# Patient Record
Sex: Male | Born: 1950 | Race: White | Hispanic: No | Marital: Married | Smoking: Never smoker
Health system: Southern US, Community
[De-identification: ages and names within clinical notes are randomized; demographics above are authoritative.]

## PROBLEM LIST (undated history)

## (undated) DIAGNOSIS — S299XXA Unspecified injury of thorax, initial encounter: Secondary | ICD-10-CM

## (undated) DIAGNOSIS — E119 Type 2 diabetes mellitus without complications: Secondary | ICD-10-CM

## (undated) HISTORY — PX: COSMETIC SURGERY: SHX468

---

## 2003-09-06 ENCOUNTER — Ambulatory Visit (HOSPITAL_COMMUNITY): Admission: RE | Admit: 2003-09-06 | Discharge: 2003-09-06 | Payer: Self-pay | Admitting: *Deleted

## 2015-02-22 ENCOUNTER — Ambulatory Visit (INDEPENDENT_AMBULATORY_CARE_PROVIDER_SITE_OTHER): Payer: BLUE CROSS/BLUE SHIELD | Admitting: Emergency Medicine

## 2015-02-22 VITALS — BP 128/78 | HR 63 | Temp 98.0°F | Resp 18 | Ht 68.5 in | Wt 179.0 lb

## 2015-02-22 DIAGNOSIS — J209 Acute bronchitis, unspecified: Secondary | ICD-10-CM

## 2015-02-22 DIAGNOSIS — J0140 Acute pansinusitis, unspecified: Secondary | ICD-10-CM | POA: Diagnosis not present

## 2015-02-22 MED ORDER — AMOXICILLIN-POT CLAVULANATE 875-125 MG PO TABS: 1.0000 | ORAL_TABLET | Freq: Two times a day (BID) | ORAL | Status: DC

## 2015-02-22 MED ORDER — HYDROCOD POLST-CPM POLST ER 10-8 MG/5ML PO SUER: 5.0000 mL | Freq: Two times a day (BID) | ORAL | Status: DC

## 2015-02-22 MED ORDER — PSEUDOEPHEDRINE-GUAIFENESIN ER 60-600 MG PO TB12: 1.0000 | ORAL_TABLET | Freq: Two times a day (BID) | ORAL | Status: DC

## 2015-02-22 NOTE — Progress Notes (Signed)
Subjective:  Patient ID: Anthony Forbes, male    DOB: Apr 29, 1950  Age: 64 y.o. MRN: [id number]  CC: Sore Throat and Cough   HPI [name] presents  with nasal congestion postnasal drainage and nasal discharge purulent color. He has pressure in his cheeks his cheeks and forehead. Says he has a cough that's minimally productive he has increased cough when he lays down. He's said he is unable to sleep at night because of cough is no wheezing or shortness breath. He has no fever or chills. No nausea vomiting or stool change. No rash. No improvement with over-the-counter medication.  History Nirav has no past medical history on file.   He has no past surgical history on file.   His  family history is not on file.  He   reports that he has never smoked. He does not have any smokeless tobacco history on file. He reports that he drinks about 1.2 - 1.8 oz of alcohol per week. He reports that he does not use illicit drugs.  No outpatient prescriptions prior to visit.   No facility-administered medications prior to visit.    Social History   Social History  . Marital Status: Single    Spouse Name: N/A  . Number of Children: N/A  . Years of Education: N/A   Social History Main Topics  . Smoking status: Never Smoker   . Smokeless tobacco: None  . Alcohol Use: 1.2 - 1.8 oz/week    2-3 Standard drinks or equivalent per week  . Drug Use: No  . Sexual Activity: Not Asked   Other Topics Concern  . None   Social History Narrative  . None     Review of Systems  Constitutional: Positive for fatigue. Negative for fever, chills and appetite change.  HENT: Positive for congestion, postnasal drip, rhinorrhea, sinus pressure and sore throat. Negative for ear pain.   Eyes: Negative for pain and redness.  Respiratory: Positive for cough. Negative for shortness of breath and wheezing.   Cardiovascular: Negative for leg swelling.  Gastrointestinal: Negative for nausea,  vomiting, abdominal pain, diarrhea, constipation and blood in stool.  Endocrine: Negative for polyuria.  Genitourinary: Negative for dysuria, urgency, frequency and flank pain.  Musculoskeletal: Negative for gait problem.  Skin: Negative for rash.  Neurological: Negative for weakness and headaches.  Psychiatric/Behavioral: Negative for confusion and decreased concentration. The patient is not nervous/anxious.     Objective:  BP 128/78 mmHg  Pulse 63  Temp(Src) 98 F (36.7 C) (Oral)  Resp 18  Ht 5' 8.5" (1.74 m)  Wt 179 lb (81.194 kg)  BMI 26.82 kg/m2  SpO2 98%  Physical Exam  Constitutional: He is oriented to person, place, and time. He appears well-developed and well-nourished. No distress.  HENT:  Head: Normocephalic and atraumatic.  Right Ear: External ear normal.  Left Ear: External ear normal.  Nose: Nose normal.  Eyes: Conjunctivae and EOM are normal. Pupils are equal, round, and reactive to light. No scleral icterus.  Neck: Normal range of motion. Neck supple. No tracheal deviation present.  Cardiovascular: Normal rate, regular rhythm and normal heart sounds.   Pulmonary/Chest: Effort normal. No respiratory distress. He has no wheezes. He has no rales.  Abdominal: He exhibits no mass. There is no tenderness. There is no rebound and no guarding.  Musculoskeletal: He exhibits no edema.  Lymphadenopathy:    He has no cervical adenopathy.  Neurological: He is alert and oriented to person, place, and time. Coordination  normal.  Skin: Skin is warm and dry. No rash noted.  Psychiatric: He has a normal mood and affect. His behavior is normal.      Assessment & Plan:   Ronald PippinsDelbert was seen today for sore throat and cough.  Diagnoses and all orders for this visit:  Acute bronchitis, unspecified organism  Acute pansinusitis, recurrence not specified  Other orders -     amoxicillin-clavulanate (AUGMENTIN) 875-125 MG tablet; Take 1 tablet by mouth 2 (two) times daily. -      pseudoephedrine-guaifenesin (MUCINEX D) 60-600 MG 12 hr tablet; Take 1 tablet by mouth every 12 (twelve) hours. -     chlorpheniramine-HYDROcodone (TUSSIONEX PENNKINETIC ER) 10-8 MG/5ML SUER; Take 5 mLs by mouth 2 (two) times daily.  I am having Mr. Chisum start on amoxicillin-clavulanate, pseudoephedrine-guaifenesin, and chlorpheniramine-HYDROcodone.  Meds ordered this encounter  Medications  . amoxicillin-clavulanate (AUGMENTIN) 875-125 MG tablet    Sig: Take 1 tablet by mouth 2 (two) times daily.    Dispense:  20 tablet    Refill:  0  . pseudoephedrine-guaifenesin (MUCINEX D) 60-600 MG 12 hr tablet    Sig: Take 1 tablet by mouth every 12 (twelve) hours.    Dispense:  18 tablet    Refill:  0  . chlorpheniramine-HYDROcodone (TUSSIONEX PENNKINETIC ER) 10-8 MG/5ML SUER    Sig: Take 5 mLs by mouth 2 (two) times daily.    Dispense:  60 mL    Refill:  0    Appropriate red flag conditions were discussed with the patient as well as actions that should be taken.  Patient expressed his understanding.  Follow-up: Return if symptoms worsen or fail to improve.  Carmelina DaneAnderson, Julie-Anne Torain S, MD

## 2015-02-22 NOTE — Patient Instructions (Signed)

## 2015-04-18 ENCOUNTER — Ambulatory Visit (INDEPENDENT_AMBULATORY_CARE_PROVIDER_SITE_OTHER): Payer: BLUE CROSS/BLUE SHIELD

## 2015-04-18 ENCOUNTER — Ambulatory Visit (INDEPENDENT_AMBULATORY_CARE_PROVIDER_SITE_OTHER): Payer: BLUE CROSS/BLUE SHIELD | Admitting: Emergency Medicine

## 2015-04-18 VITALS — BP 124/80 | HR 70 | Temp 98.6°F | Resp 17 | Ht 67.0 in | Wt 177.0 lb

## 2015-04-18 DIAGNOSIS — R05 Cough: Secondary | ICD-10-CM | POA: Diagnosis not present

## 2015-04-18 DIAGNOSIS — R059 Cough, unspecified: Secondary | ICD-10-CM

## 2015-04-18 DIAGNOSIS — R062 Wheezing: Secondary | ICD-10-CM | POA: Diagnosis not present

## 2015-04-18 LAB — POCT CBC
Granulocyte percent: 65 %G (ref 37–80)
HCT, POC: 41.9 % — AB (ref 43.5–53.7)
Hemoglobin: 14.6 g/dL (ref 14.1–18.1)
Lymph, poc: 1.7 (ref 0.6–3.4)
MCH: 28.4 pg (ref 27–31.2)
MCHC: 34.8 g/dL (ref 31.8–35.4)
MCV: 81.7 fL (ref 80–97)
MID (CBC): 0.6 (ref 0–0.9)
MPV: 8.3 fL (ref 0–99.8)
PLATELET COUNT, POC: 186 10*3/uL (ref 142–424)
POC Granulocyte: 4.2 (ref 2–6.9)
POC LYMPH PERCENT: 26.3 %L (ref 10–50)
POC MID %: 8.7 %M (ref 0–12)
RBC: 5.13 M/uL (ref 4.69–6.13)
RDW, POC: 15.5 %
WBC: 6.5 10*3/uL (ref 4.6–10.2)

## 2015-04-18 MED ORDER — DOXYCYCLINE HYCLATE 100 MG PO TABS: 100.0000 mg | ORAL_TABLET | Freq: Two times a day (BID) | ORAL | Status: DC

## 2015-04-18 MED ORDER — ALBUTEROL SULFATE (2.5 MG/3ML) 0.083% IN NEBU
2.5000 mg | INHALATION_SOLUTION | Freq: Once | RESPIRATORY_TRACT | Status: AC
  Administered 2015-04-18: 2.5 mg via RESPIRATORY_TRACT

## 2015-04-18 MED ORDER — IPRATROPIUM BROMIDE 0.02 % IN SOLN
0.5000 mg | Freq: Once | RESPIRATORY_TRACT | Status: AC
  Administered 2015-04-18: 0.5 mg via RESPIRATORY_TRACT

## 2015-04-18 NOTE — Patient Instructions (Signed)

## 2015-04-18 NOTE — Progress Notes (Addendum)
Patient ID: Anthony Forbes, male   DOB: 12/02/1950, 65 y.o.   MRN: [id number]    By signing my name below, I, Essence Howell, attest that this documentation has been prepared under the direction and in the presence of Collene GobbleSteven A Marquest Gunkel, MD Electronically Signed: Charline BillsEssence Howell, ED Scribe 04/18/2015 at 8:26 AM.  Chief Complaint:  Chief Complaint  Patient presents with  . Cough  . URI   HPI: Anthony ChardDelbert R Phung is a 65 y.o. male who [facility] complaining of gradually worsening productive cough with white sputum for the past week.  Pt reports associated chest congestion, wheezing, night sweats and subjective fever that has resolved. He was seen in office in November 2016 for the same, started on Amoxicillin with resolve. Pt reports working excessively due to the holidays and suspects that this may have caused a decline his resistance. He denies sore throat, nausea, vomiting. Pt reports h/o PNA approximately 30 years ago. No h/o asthma, bronchitis or inhaler use. No sick contacts.   No past medical history on file. No past surgical history on file. Social History   Social History  . Marital Status: Married    Spouse Name: N/A  . Number of Children: N/A  . Years of Education: N/A   Social History Main Topics  . Smoking status: Never Smoker   . Smokeless tobacco: None  . Alcohol Use: 1.2 - 1.8 oz/week    2-3 Standard drinks or equivalent per week  . Drug Use: No  . Sexual Activity: Not Asked   Other Topics Concern  . None   Social History Narrative   No family history on file. No Known Allergies Prior to Admission medications   Not on File    ROS: The patient denies chills, sore throat, unintentional weight loss, chest pain, palpitations, dyspnea on exertion, nausea, vomiting, abdominal pain, dysuria, hematuria, melena, numbness, weakness, or tingling. +fever, +night sweats, +wheezing, +cough  All other systems have been reviewed and were otherwise negative with the  exception of those mentioned in the HPI and as above.    PHYSICAL EXAM: Filed Vitals:   04/18/15 0813  BP: 124/80  Pulse: 76  Temp: 98.6 F (37 C)  Resp: 17   Body mass index is 27.72 kg/(m^2).   General: Alert, no acute distress HEENT:  Normocephalic, atraumatic, oropharynx patent. [facility] Cardiovascular:  Regular rate and rhythm, no rubs murmurs or gallops.  No Carotid bruits, radial pulse intact. No pedal edema.  Respiratory: No rales. With forced expiration there is wheezing noted. Coarse rhonchi.  No cyanosis, no use of accessory musculature Abdominal: No organomegaly, abdomen is soft and non-tender, positive bowel sounds.  No masses. Musculoskeletal: Gait intact. No edema, tenderness Skin: No rashes. Neurologic: Facial musculature symmetric. Psychiatric: Patient acts appropriately throughout our interaction. Lymphatic: No cervical or submandibular lymphadenopathy Meds ordered this encounter  Medications  . albuterol (PROVENTIL) (2.5 MG/3ML) 0.083% nebulizer solution 2.5 mg    Sig:   . ipratropium (ATROVENT) nebulizer solution 0.5 mg    Sig:   . doxycycline (VIBRA-TABS) 100 MG tablet    Sig: Take 1 tablet (100 mg total) by mouth 2 (two) times daily.    Dispense:  20 tablet    Refill:  0   LABS: Results for orders placed or performed in visit on 04/18/15  POCT CBC  Result Value Ref Range   WBC 6.5 4.6 - 10.2 K/uL   Lymph, poc 1.7 0.6 - 3.4   POC LYMPH  PERCENT 26.3 10 - 50 %L   MID (cbc) 0.6 0 - 0.9   POC MID % 8.7 0 - 12 %M   POC Granulocyte 4.2 2 - 6.9   Granulocyte percent 65.0 37 - 80 %G   RBC 5.13 4.69 - 6.13 M/uL   Hemoglobin 14.6 14.1 - 18.1 g/dL   HCT, POC 16.1 (A) 09.6 - 53.7 %   MCV 81.7 80 - 97 fL   MCH, POC 28.4 27 - 31.2 pg   MCHC 34.8 31.8 - 35.4 g/dL   RDW, POC 04.5 %   Platelet Count, POC 186 142 - 424 K/uL   MPV 8.3 0 - 99.8 fL   EKG/XRAY:   Primary read interpreted by Dr. Cleta Alberts at [facility]. There is significant increased markings  left base   ASSESSMENT/PLAN: CBC unremarkable. There is significant increased markings in the left base. We'll give a nebulizer treatment today. We'll treat as acute bronchitis. He was placed on doxycycline. He did not get any improvement with the nebulizers so he was not given an albuterol prescription.I personally performed the services described in this documentation, which was scribed in my presence. The recorded information has been reviewed and is accurate.    Gross sideeffects, risk and benefits, and alternatives of medications d/w patient. Patient is aware that all medications have potential sideeffects and we are unable to predict every sideeffect or drug-drug interaction that may occur.  Lesle Chris MD 04/18/2015 8:17 AM

## 2017-05-11 ENCOUNTER — Emergency Department (HOSPITAL_COMMUNITY)
Admission: EM | Admit: 2017-05-11 | Discharge: 2017-05-11 | Disposition: A | Discharge status: Home / Self Care | Payer: Medicare HMO | Attending: Emergency Medicine | Admitting: Emergency Medicine

## 2017-05-11 ENCOUNTER — Other Ambulatory Visit: Payer: Self-pay

## 2017-05-11 ENCOUNTER — Encounter (HOSPITAL_COMMUNITY): Payer: Self-pay

## 2017-05-11 DIAGNOSIS — R5383 Other fatigue: Secondary | ICD-10-CM | POA: Insufficient documentation

## 2017-05-11 DIAGNOSIS — M6281 Muscle weakness (generalized): Secondary | ICD-10-CM | POA: Insufficient documentation

## 2017-05-11 DIAGNOSIS — R531 Weakness: Secondary | ICD-10-CM

## 2017-05-11 DIAGNOSIS — R61 Generalized hyperhidrosis: Secondary | ICD-10-CM | POA: Insufficient documentation

## 2017-05-11 LAB — COMPREHENSIVE METABOLIC PANEL
ALT: 16 U/L — ABNORMAL LOW (ref 17–63)
ANION GAP: 10 (ref 5–15)
AST: 25 U/L (ref 15–41)
Albumin: 3.7 g/dL (ref 3.5–5.0)
Alkaline Phosphatase: 40 U/L (ref 38–126)
BILIRUBIN TOTAL: 1.3 mg/dL — AB (ref 0.3–1.2)
BUN: 14 mg/dL (ref 6–20)
CO2: 22 mmol/L (ref 22–32)
Calcium: 9.1 mg/dL (ref 8.9–10.3)
Chloride: 108 mmol/L (ref 101–111)
Creatinine, Ser: 1.1 mg/dL (ref 0.61–1.24)
GFR calc Af Amer: >60 mL/min (ref >60)
Glucose, Bld: 186 mg/dL — ABNORMAL HIGH (ref 65–99)
POTASSIUM: 4.9 mmol/L (ref 3.5–5.1)
Sodium: 140 mmol/L (ref 135–145)
TOTAL PROTEIN: 6.2 g/dL — AB (ref 6.5–8.1)

## 2017-05-11 LAB — INFLUENZA PANEL BY PCR (TYPE A & B)
INFLAPCR: NEGATIVE
Influenza B By PCR: NEGATIVE

## 2017-05-11 LAB — CBC WITH DIFFERENTIAL/PLATELET
BASOS PCT: 0 %
Basophils Absolute: 0 10*3/uL (ref 0.0–0.1)
EOS PCT: 0 %
Eosinophils Absolute: 0 10*3/uL (ref 0.0–0.7)
HEMATOCRIT: 44.2 % (ref 39.0–52.0)
Hemoglobin: 15.2 g/dL (ref 13.0–17.0)
LYMPHS PCT: 3 %
Lymphs Abs: 0.4 10*3/uL — ABNORMAL LOW (ref 0.7–4.0)
MCH: 31.8 pg (ref 26.0–34.0)
MCHC: 34.4 g/dL (ref 30.0–36.0)
MCV: 92.5 fL (ref 78.0–100.0)
MONOS PCT: 4 %
Monocytes Absolute: 0.6 10*3/uL (ref 0.1–1.0)
NEUTROS ABS: 14.2 10*3/uL — AB (ref 1.7–7.7)
Neutrophils Relative %: 93 %
PLATELETS: 191 10*3/uL (ref 150–400)
RBC: 4.78 MIL/uL (ref 4.22–5.81)
RDW: 12.7 % (ref 11.5–15.5)
WBC: 15.2 10*3/uL — ABNORMAL HIGH (ref 4.0–10.5)

## 2017-05-11 LAB — I-STAT TROPONIN, ED
TROPONIN I, POC: 0 ng/mL (ref 0.00–0.08)
TROPONIN I, POC: 0 ng/mL (ref 0.00–0.08)

## 2017-05-11 MED ORDER — SODIUM CHLORIDE 0.9 % IV BOLUS (SEPSIS)
500.0000 mL | Freq: Once | INTRAVENOUS | Status: AC
  Administered 2017-05-11: 500 mL via INTRAVENOUS

## 2017-05-11 NOTE — ED Notes (Signed)
ED Provider at bedside. 

## 2017-05-11 NOTE — ED Triage Notes (Signed)
Pty c/o increased fatique over course of day. Pt states he was skiing yesterday and only drank 1.5 bottles of water. And drank 3 beers with [employer]. C/o dizzy, diaphoretic and blurred vision increased until 1430. Came POV to fire station. Initial BP 86/p given 500cc NS PTA.Denies c/o on arrival.

## 2017-05-11 NOTE — Discharge Instructions (Signed)
It was my pleasure taking care of you today!   I will call you if your flu test comes back positive.   Please call both your primary care doctor and the cardiology clinic listed to schedule a follow up appointment.   Return to ER for new or worsening symptoms, any additional concerns.

## 2017-05-11 NOTE — ED Provider Notes (Signed)
[facility] Provider Note   CSN: [id number] Arrival date & time: 05/11/17  1609     History   Chief Complaint Chief Complaint  Patient presents with  . Weakness    HPI Anthony ChardDelbert R Samano is a 67 y.o. male.  The history is provided by the patient and medical records. No language interpreter was used.   Anthony Forbes is a 67 y.o. male  with a PMH with no known PMH and on no daily medications who presents to the Emergency Department complaining of weakness and fatigue which began today. Patient reports that he went snow skiing with grandchildren yesterday. He had a very active day and only drank 1-2 water bottles. When he awoke this morning, he felt tired and thirsty, but did not think much about it.  Today, he felt as if his fatigue worsened.  At about 230, he suddenly felt sweaty all over.  He denies any additional symptoms with this.  He states that he had no headaches, blurred vision, dizziness, chest pain, shortness of breath, abdominal pain or back pain.  He just suddenly felt very sweaty and much more tired.  He has no history of this happening in the past.  His daughter gave him full strength aspirin and drove him to the fire station.  Per nursing staff, initial blood pressure 86/p and 500 cc normal saline given prior to arrival.  Patient states that he now feels better, still feels very weak, but no longer sweaty.  History reviewed. No pertinent past medical history.  There are no active problems to display for this patient.   Past Surgical History:  Procedure Laterality Date  . COSMETIC SURGERY         Home Medications    Prior to Admission medications   Medication Sig Start Date End Date Taking? Authorizing Provider  doxycycline (VIBRA-TABS) 100 MG tablet Take 1 tablet (100 mg total) by mouth 2 (two) times daily. 04/18/15   Collene Gobbleaub, Steven A, MD    Family History No family history on file.  Social History Social History    Tobacco Use  . Smoking status: Never Smoker  . Smokeless tobacco: Never Used  Substance Use Topics  . Alcohol use: Yes    Alcohol/week: 1.2 - 1.8 oz    Types: 2 - 3 Standard drinks or equivalent per week  . Drug use: No     Allergies   Patient has no known allergies.   Review of Systems Review of Systems  Constitutional: Positive for diaphoresis. Negative for chills and fever.  All other systems reviewed and are negative.    Physical Exam Updated Vital Signs BP 136/87   Pulse 80   Temp 98.1 F (36.7 C) (Oral)   Resp (!) 23   Ht 5\' 8"  (1.727 m)   Wt 78 kg (172 lb)   SpO2 97%   BMI 26.15 kg/m   Physical Exam  Constitutional: He is oriented to person, place, and time. He appears well-developed and well-nourished. No distress.  HENT:  Head: Normocephalic and atraumatic.  Neck: Neck supple. No JVD present.  Cardiovascular: Normal rate, regular rhythm and normal heart sounds.  No murmur heard. Pulmonary/Chest: Effort normal and breath sounds normal. No respiratory distress. He has no wheezes. He has no rales.  Abdominal: Soft. He exhibits no distension. There is no tenderness.  Musculoskeletal: He exhibits no edema.  Neurological: He is alert and oriented to person, place, and time.  Skin: Skin is warm  and dry.  Nursing note and vitals reviewed.    ED Treatments / Results  Labs (all labs ordered are listed, but only abnormal results are displayed) Labs Reviewed  CBC WITH DIFFERENTIAL/PLATELET - Abnormal; Notable for the following components:      Result Value   WBC 15.2 (*)    Neutro Abs 14.2 (*)    Lymphs Abs 0.4 (*)    All other components within normal limits  COMPREHENSIVE METABOLIC PANEL - Abnormal; Notable for the following components:   Glucose, Bld 186 (*)    Total Protein 6.2 (*)    ALT 16 (*)    Total Bilirubin 1.3 (*)    All other components within normal limits  INFLUENZA PANEL BY PCR (TYPE A & B)  I-STAT TROPONIN, ED  I-STAT TROPONIN, ED     EKG  EKG Interpretation  Date/Time:  Monday May 11 2017 16:56:20 EST Ventricular Rate:  79 PR Interval:    QRS Duration: 104 QT Interval:  377 QTC Calculation: 433 R Axis:   27 Text Interpretation:  Sinus rhythm Borderline T wave abnormalities Baseline wander in lead(s) V1 significant artifact, but no prior tracings available Confirmed by Jerelyn Scott [phone]) on 05/11/2017 5:36:02 PM       Radiology No results found.  Procedures Procedures (including critical care time)  Medications Ordered in ED Medications  sodium chloride 0.9 % bolus 500 mL (0 mLs Intravenous Stopped 05/11/17 1825)     Initial Impression / Assessment and Plan / ED Course  I have reviewed the triage vital signs and the nursing notes.  Pertinent labs & imaging results that were available during my care of the patient were reviewed by me and considered in my medical decision making (see chart for details).    TRUEMAN WORLDS is a 67 y.o. male who presents to ED for weakness, fatigue and diaphoresis beginning today. Per EMS, hypotensive in the field and given 500cc saline PTA. Normotensive / hemodynamically stable upon arrival to ED. Denies chest pain and shortness of breath. Does have leukocytosis of 15.2. Flu negative. Given 1L IV fluids in total and feels much improved on re-evaluation. EKG with non-specific t wave changes. Troponin negative x 2. Heart score of 3. Evaluation does not show pathology that would require ongoing emergent intervention or inpatient treatment. Feel patient does need outpatient cardiology follow up and referral information provided. He will also follow up with PCP. He and wife are comfortable with this plan, however we did speak at length about return precautions. All questions answered.   Patient seen by and discussed with Dr. Phineas Real who agrees with treatment plan.   Final Clinical Impressions(s) / ED Diagnoses   Final diagnoses:  Weakness    ED Discharge Orders     None       Bri Wakeman, Chase Picket, PA-C 05/11/17 2330    Phillis Haggis, MD 05/12/17 [phone]

## 2018-07-11 ENCOUNTER — Other Ambulatory Visit: Payer: Self-pay

## 2018-07-11 ENCOUNTER — Emergency Department (HOSPITAL_BASED_OUTPATIENT_CLINIC_OR_DEPARTMENT_OTHER): Payer: Medicare HMO

## 2018-07-11 ENCOUNTER — Encounter (HOSPITAL_BASED_OUTPATIENT_CLINIC_OR_DEPARTMENT_OTHER): Payer: Self-pay | Admitting: Emergency Medicine

## 2018-07-11 ENCOUNTER — Emergency Department (HOSPITAL_BASED_OUTPATIENT_CLINIC_OR_DEPARTMENT_OTHER)
Admission: EM | Admit: 2018-07-11 | Discharge: 2018-07-11 | Disposition: A | Discharge status: Home / Self Care | Payer: Medicare HMO | Attending: Emergency Medicine | Admitting: Emergency Medicine

## 2018-07-11 DIAGNOSIS — S0181XA Laceration without foreign body of other part of head, initial encounter: Secondary | ICD-10-CM | POA: Insufficient documentation

## 2018-07-11 DIAGNOSIS — Y92007 Garden or yard of unspecified non-institutional (private) residence as the place of occurrence of the external cause: Secondary | ICD-10-CM | POA: Diagnosis not present

## 2018-07-11 DIAGNOSIS — S61012A Laceration without foreign body of left thumb without damage to nail, initial encounter: Secondary | ICD-10-CM | POA: Diagnosis present

## 2018-07-11 DIAGNOSIS — W260XXA Contact with knife, initial encounter: Secondary | ICD-10-CM | POA: Insufficient documentation

## 2018-07-11 DIAGNOSIS — R55 Syncope and collapse: Secondary | ICD-10-CM | POA: Insufficient documentation

## 2018-07-11 DIAGNOSIS — Z23 Encounter for immunization: Secondary | ICD-10-CM | POA: Diagnosis not present

## 2018-07-11 DIAGNOSIS — Y999 Unspecified external cause status: Secondary | ICD-10-CM | POA: Diagnosis not present

## 2018-07-11 DIAGNOSIS — Y93H2 Activity, gardening and landscaping: Secondary | ICD-10-CM | POA: Diagnosis not present

## 2018-07-11 DIAGNOSIS — W19XXXA Unspecified fall, initial encounter: Secondary | ICD-10-CM

## 2018-07-11 LAB — BASIC METABOLIC PANEL
Anion gap: 5 (ref 5–15)
BUN: 14 mg/dL (ref 8–23)
CO2: 24 mmol/L (ref 22–32)
Calcium: 9.7 mg/dL (ref 8.9–10.3)
Chloride: 105 mmol/L (ref 98–111)
Creatinine, Ser: 1.03 mg/dL (ref 0.61–1.24)
GFR calc Af Amer: >60 mL/min (ref >60)
GFR calc non Af Amer: >60 mL/min (ref >60)
Glucose, Bld: 261 mg/dL — ABNORMAL HIGH (ref 70–99)
Potassium: 4.1 mmol/L (ref 3.5–5.1)
Sodium: 134 mmol/L — ABNORMAL LOW (ref 135–145)

## 2018-07-11 LAB — CBC WITH DIFFERENTIAL/PLATELET
Abs Immature Granulocytes: 0.07 10*3/uL (ref 0.00–0.07)
Basophils Absolute: 0 10*3/uL (ref 0.0–0.1)
Basophils Relative: 0 %
Eosinophils Absolute: 0 10*3/uL (ref 0.0–0.5)
Eosinophils Relative: 0 %
HCT: 46.2 % (ref 39.0–52.0)
Hemoglobin: 15.6 g/dL (ref 13.0–17.0)
Immature Granulocytes: 1 %
Lymphocytes Relative: 8 %
Lymphs Abs: 1.1 10*3/uL (ref 0.7–4.0)
MCH: 31.5 pg (ref 26.0–34.0)
MCHC: 33.8 g/dL (ref 30.0–36.0)
MCV: 93.1 fL (ref 80.0–100.0)
Monocytes Absolute: 0.8 10*3/uL (ref 0.1–1.0)
Monocytes Relative: 6 %
Neutro Abs: 12.5 10*3/uL — ABNORMAL HIGH (ref 1.7–7.7)
Neutrophils Relative %: 85 %
Platelets: 186 10*3/uL (ref 150–400)
RBC: 4.96 MIL/uL (ref 4.22–5.81)
RDW: 12.5 % (ref 11.5–15.5)
WBC: 14.6 10*3/uL — ABNORMAL HIGH (ref 4.0–10.5)
nRBC: 0 % (ref 0.0–0.2)

## 2018-07-11 MED ORDER — TETANUS-DIPHTH-ACELL PERTUSSIS 5-2.5-18.5 LF-MCG/0.5 IM SUSP
0.5000 mL | Freq: Once | INTRAMUSCULAR | Status: AC
  Administered 2018-07-11: 18:00:00 0.5 mL via INTRAMUSCULAR
  Filled 2018-07-11: qty 0.5

## 2018-07-11 MED ORDER — LIDOCAINE HCL (PF) 1 % IJ SOLN
5.0000 mL | Freq: Once | INTRAMUSCULAR | Status: AC
  Administered 2018-07-11: 5 mL
  Filled 2018-07-11: qty 5

## 2018-07-11 NOTE — ED Notes (Signed)
ED Provider at bedside. 

## 2018-07-11 NOTE — ED Notes (Signed)
Pt reports falling when trying to clean his lac- pt had LOC. Reports hitting his head and glasses caused abrasion to face

## 2018-07-11 NOTE — ED Provider Notes (Signed)
[facility] EMERGENCY DEPARTMENT Provider Note   CSN: [id number] Arrival date & time: 07/11/18  1712    History   Chief Complaint Chief Complaint  Patient presents with  . Laceration    HPI Anthony Forbes is a 68 y.o. male who presents to the ED for laceration to left thumb that occurred 1 hr PTA. Pt reports he was cutting a tree limb with his pocket knife when he accidentally cut his finger. He immediately went inside to clean out the area when he began feeling lightheaded. Pt attempted to crouch down to the ground and was about 3 feet from the ground when he had a syncopal episode and hit his head on the ground. Pt was wearing his glasses at the time which cut him on the bridge of his nose and above the left eyebrow. No chest pain or shortness of breath prior to syncopal episode. Pt came to immediately and his wife called EMS. EMS applied bandage to left thumb and advised that he come to the ED for sutures. Pt complaining of mild pain to the area but denies loss of sensation or paresthesias. Tetanus not UTD. Pt not on anticoagulants.        History reviewed. No pertinent past medical history.  There are no active problems to display for this patient.   Past Surgical History:  Procedure Laterality Date  . COSMETIC SURGERY          Home Medications    Prior to Admission medications   Medication Sig Start Date End Date Taking? Authorizing Provider  doxycycline (VIBRA-TABS) 100 MG tablet Take 1 tablet (100 mg total) by mouth 2 (two) times daily. 04/18/15   Collene Gobble, MD    Family History No family history on file.  Social History Social History   Tobacco Use  . Smoking status: Never Smoker  . Smokeless tobacco: Never Used  Substance Use Topics  . Alcohol use: Yes    Alcohol/week: 2.0 - 3.0 standard drinks    Types: 2 - 3 Standard drinks or equivalent per week  . Drug use: No     Allergies   Patient has no known allergies.   Review of  Systems Review of Systems  Constitutional: Negative for fever.  HENT: Negative for ear pain.   Eyes: Negative for visual disturbance.  Respiratory: Negative for shortness of breath.   Cardiovascular: Negative for chest pain.  Gastrointestinal: Negative for nausea and vomiting.  Musculoskeletal: Positive for arthralgias.  Skin: Positive for wound.  Neurological: Positive for syncope. Negative for headaches.  Hematological: Does not bruise/bleed easily.  Psychiatric/Behavioral: Negative for confusion.     Physical Exam Updated Vital Signs BP 133/70 (BP Location: Right Arm)   Pulse (!) 58   Temp 98.2 F (36.8 C) (Oral)   Resp 18   Ht 5\' 8"  (1.727 m)   Wt 77.1 kg   SpO2 100%   BMI 25.85 kg/m   Physical Exam Vitals signs and nursing note reviewed.  Constitutional:      Appearance: He is not ill-appearing.  HENT:     Head: Normocephalic.     Comments: Superficial laceration to bridge of nose as well as above left eyebrow (see photos below)    Right Ear: Tympanic membrane normal.     Left Ear: Tympanic membrane normal.     Ears:     Comments: Negative hemotympanum bilaterally.     Nose: No rhinorrhea.  Eyes:     Conjunctiva/sclera: Conjunctivae normal.  Pupils: Pupils are equal, round, and reactive to light.  Neck:     Musculoskeletal: Neck supple.  Cardiovascular:     Rate and Rhythm: Normal rate and regular rhythm.  Pulmonary:     Effort: Pulmonary effort is normal.     Breath sounds: Normal breath sounds. No wheezing, rhonchi or rales.  Abdominal:     Palpations: Abdomen is soft.     Tenderness: There is no abdominal tenderness.  Musculoskeletal:     Comments: 2.5 cm to dorsum of left thumb; no tendon involvement appreciated. Full ROM intact; pt able to oppose thumb without issue. Sensation intact. Cap refill < 2 seconds. No tenderness to left wrist.   Skin:    General: Skin is warm and dry.  Neurological:     General: No focal deficit present.     Mental  Status: He is alert and oriented to person, place, and time.     Cranial Nerves: No cranial nerve deficit.     Sensory: No sensory deficit.     Motor: No weakness.          ED Treatments / Results  Labs (all labs ordered are listed, but only abnormal results are displayed) Labs Reviewed  BASIC METABOLIC PANEL - Abnormal; Notable for the following components:      Result Value   Sodium 134 (*)    Glucose, Bld 261 (*)    All other components within normal limits  CBC WITH DIFFERENTIAL/PLATELET - Abnormal; Notable for the following components:   WBC 14.6 (*)    Neutro Abs 12.5 (*)    All other components within normal limits    EKG EKG Interpretation  Date/Time:  Sunday July 11 2018 17:58:23 EDT Ventricular Rate:  59 PR Interval:    QRS Duration: 87 QT Interval:  422 QTC Calculation: 418 R Axis:   47 Text Interpretation:  Sinus rhythm no wpw, prolonged qt or brugada No significant change since last tracing Confirmed by Melene Plan [phone]) on 07/11/2018 6:17:49 PM   Radiology Ct Head Wo Contrast  Result Date: 07/11/2018 CLINICAL DATA:  Syncopal episode following hand injury. Fell with trauma to the head. EXAM: CT HEAD WITHOUT CONTRAST TECHNIQUE: Contiguous axial images were obtained from the base of the skull through the vertex without intravenous contrast. COMPARISON:  None. FINDINGS: Brain: The brain shows a normal appearance without evidence of malformation, atrophy, old or acute small or large vessel infarction, mass lesion, hemorrhage, hydrocephalus or extra-axial collection. Incidental and insignificant cysts of the choroid plexus of the lateral ventricles. Vascular: No hyperdense vessel. No evidence of atherosclerotic calcification. Skull: Normal.  No traumatic finding.  No focal bone lesion. Sinuses/Orbits: Sinuses are clear. Orbits appear normal. Mastoids are clear. Other: None significant IMPRESSION: Normal head CT Electronically Signed   By: Paulina Fusi M.D.   On:  07/11/2018 18:13    Procedures .Marland KitchenLaceration Repair Date/Time: 07/11/2018 7:18 PM Performed by: Tanda Rockers, PA-C Authorized by: Tanda Rockers, PA-C   Consent:    Consent obtained:  Verbal   Consent given by:  Patient   Risks discussed:  Infection and pain   Alternatives discussed:  No treatment Anesthesia (see MAR for exact dosages):    Anesthesia method:  Local infiltration   Local anesthetic:  Lidocaine 1% w/o epi Laceration details:    Location:  Hand   Hand location:  L hand, dorsum (Left thumb)   Length (cm):  2.5 Repair type:    Repair type:  Simple Pre-procedure details:  Preparation:  Patient was prepped and draped in usual sterile fashion Exploration:    Hemostasis achieved with:  Direct pressure (quick clot)   Wound exploration: wound explored through full range of motion and entire depth of wound probed and visualized     Contaminated: no   Treatment:    Area cleansed with:  Betadine and saline   Amount of cleaning:  Extensive   Irrigation solution:  Sterile saline   Irrigation volume:  20 CCs   Irrigation method:  Syringe   Visualized foreign bodies/material removed: no   Skin repair:    Repair method:  Sutures   Suture size:  3-0   Suture material:  Nylon   Suture technique:  Simple interrupted   Number of sutures:  5 Approximation:    Approximation:  Close Post-procedure details:    Dressing:  Non-adherent dressing   Patient tolerance of procedure:  Tolerated well, no immediate complications   (including critical care time)  Medications Ordered in ED Medications  Tdap (BOOSTRIX) injection 0.5 mL (0.5 mLs Intramuscular Given 07/11/18 1801)  lidocaine (PF) (XYLOCAINE) 1 % injection 5 mL (5 mLs Infiltration Given 07/11/18 1802)     Initial Impression / Assessment and Plan / ED Course  I have reviewed the triage vital signs and the nursing notes.  Pertinent labs & imaging results that were available during my care of the patient were reviewed by  me and considered in my medical decision making (see chart for details).    Pt with laceration to left thumb after cutting it with pocket knife. Bleeding initially controlled with EMS on the scene; bandage in place. Sensation intact as well as ROM. Do not feel pt needs imaging at this time given mechanism of injury but will reevaluate once wound is irrigated. CT Head ordered given pt had syncopal episode and fell onto his face; causing his glasses to cut the bridge of his nose. Pt not anticoagulated. Tetanus not UTD in the ED; will update this. EKG and baseline labs ordered as well given syncope.   Nursing staff informed that pt's wound began bleeding profusely immediately after I had stepped out of room. Quick clot applied to wound with good control of bleeding.  Laceration repair without difficulty. Dermabond applied to superficial facial lacerations. Head CT without bleed. EKG unremarkable; no arrhythmias appreciated. Pt likely had vasovagal episode given amount of blood. Baseline labs returned with elevated leukocytosis; likely due to pain. Glucose elevated at 261; pt reports he has a meter at home and checks his glucose regularly. Reports it have been in the 180s for the past couple of weeks; advised that he should follow up with PCP regarding this. Pt reports he has PCP but cannot recall name; [place] [facility] info given to pt upon discharge. 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 Strict return precautions discussed with pt. He is in agreement with plan and stable for discharge home.       Final Clinical Impressions(s) / ED Diagnoses   Final diagnoses:  Laceration of left thumb without foreign body without damage to nail, initial encounter  Facial laceration, initial encounter  Syncope, unspecified syncope type  Fall, initial encounter    ED Discharge Orders    None        Tanda Rockers, PA-C 07/11/18 2059    Melene Plan, DO 07/11/18 2239

## 2018-07-11 NOTE — ED Triage Notes (Signed)
Laceration to L thumb from a pocket knife. Wound was wrapped by EMS prior to arrival.

## 2018-07-11 NOTE — ED Notes (Signed)
Trauma guaze applied to control bleeding at this time.

## 2018-07-11 NOTE — ED Notes (Signed)
Patient transported to CT 

## 2018-07-11 NOTE — Discharge Instructions (Signed)
You were seen in the ED for a laceration to your thumb. Please keep stitches in for the next 7 days; you may either go to your PCP or the ED for removal. You may remove them yourself if you feel comfortable. Try to keep thumb from bending as much as possible to not pop stitches out. Return to the ED for any redness around the site, swelling, drainage of pus, or fever.   While in the ED your glucose was found to be 261. Please follow up with your PCP regarding this. If you do not have a PCP you can follow up with [facility].

## 2019-11-22 IMAGING — CT CT HEAD WITHOUT CONTRAST
3 series · 15 of 47 positions shown, 18 images · non-contrast
Comparison: None.

CLINICAL DATA: Syncopal episode following hand injury. Fell with
trauma to the head.

EXAM:
CT HEAD WITHOUT CONTRAST
TECHNIQUE: Contiguous axial images were obtained from the base of the skull
through the vertex without intravenous contrast.

[Series 2: head wo · axial · 0.49mm/px · z∈[-189,-49]mm · 9 of 34 slices shown, 12 images]
[im 3/34  brain]
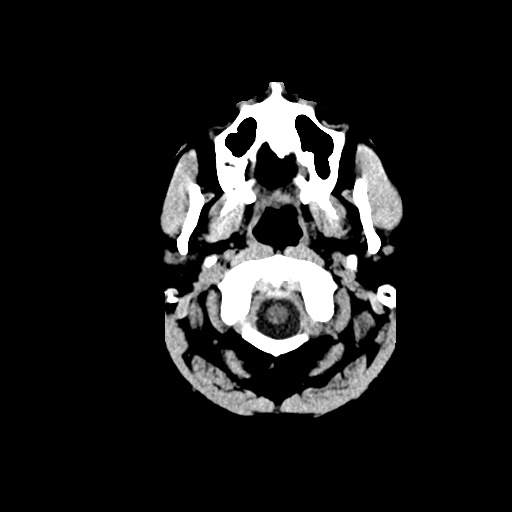
[im 3/34  bone]
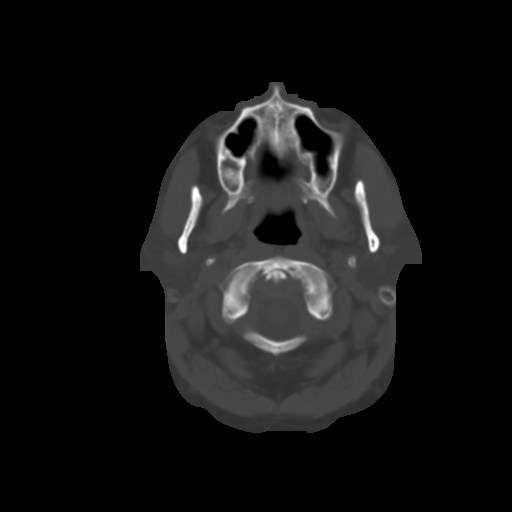
[im 6/34  brain]
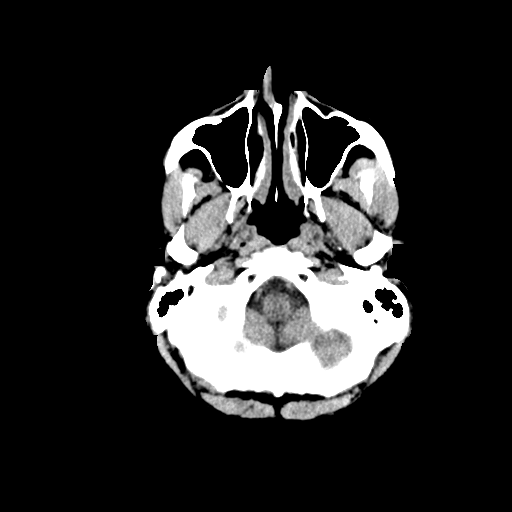
[im 10/34  brain]
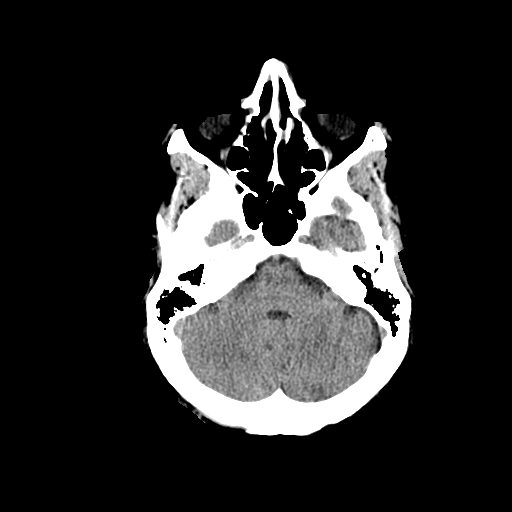
[im 13/34  brain]
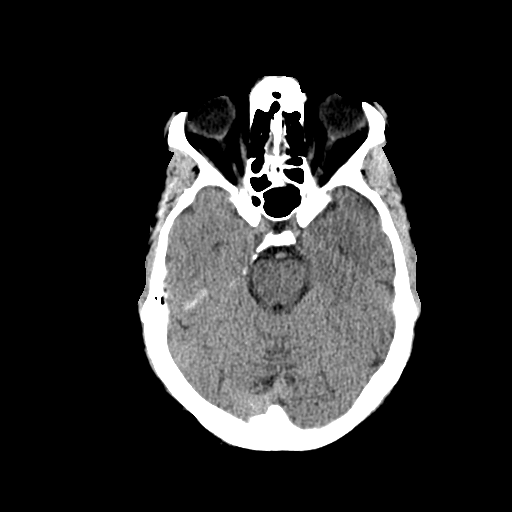
[im 18/34  brain]
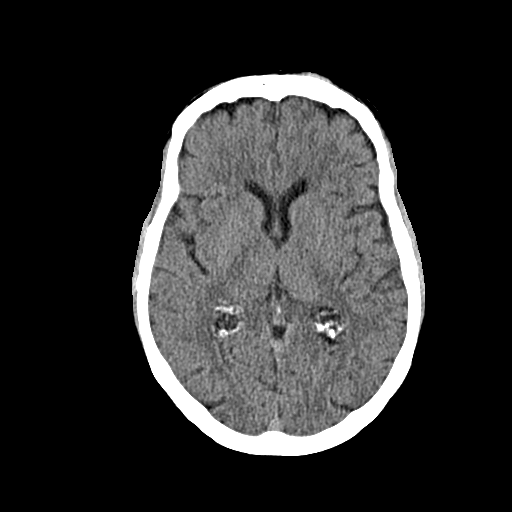
[im 18/34  bone]
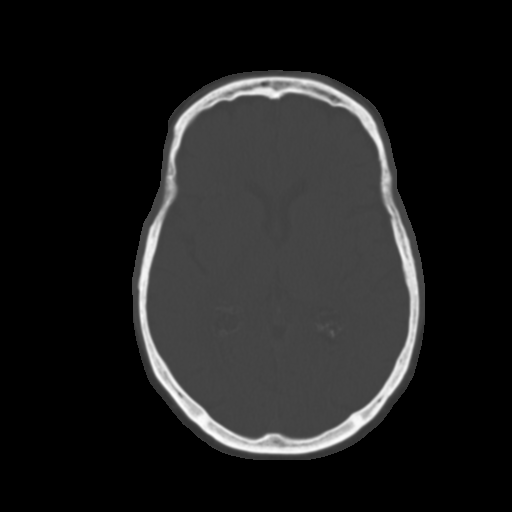
[im 21/34  brain]
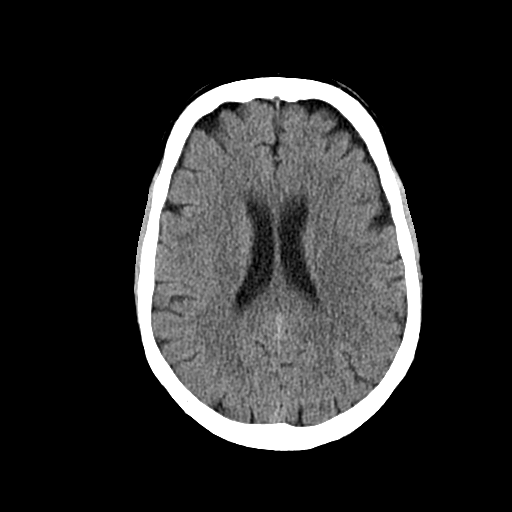
[im 24/34  brain]
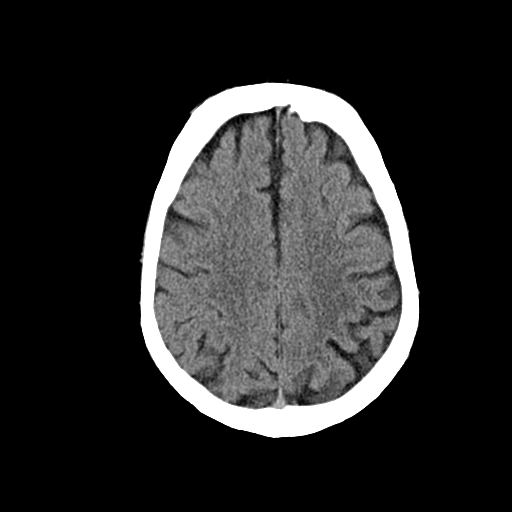
[im 28/34  brain]
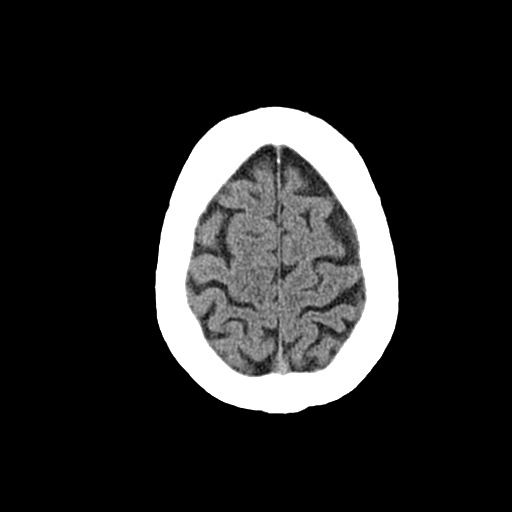
[im 31/34  brain]
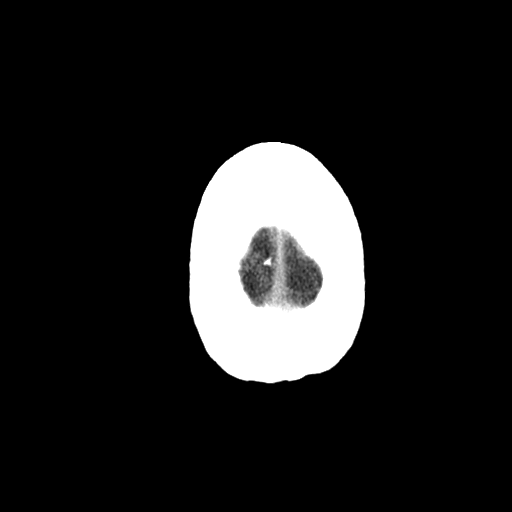
[im 31/34  bone]
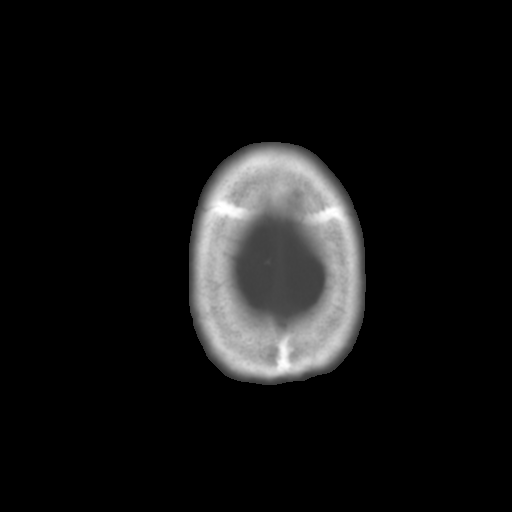

[Series 4: cor soft · coronal · 0.39mm/px · 3 of 84 slices shown]
[im 28/84  brain]
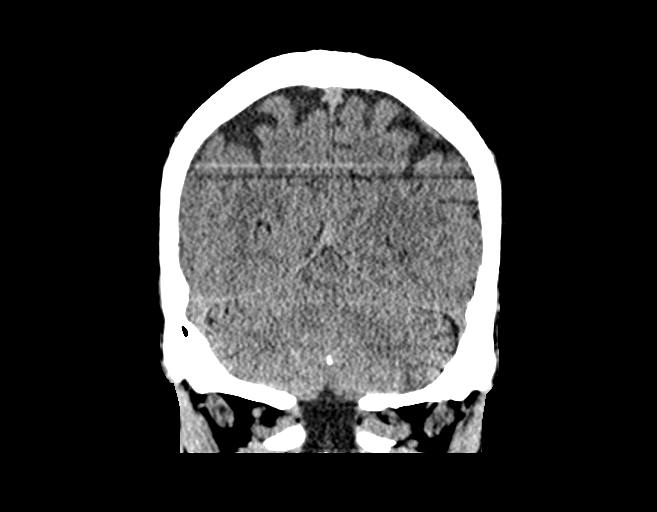
[im 37/84  brain]
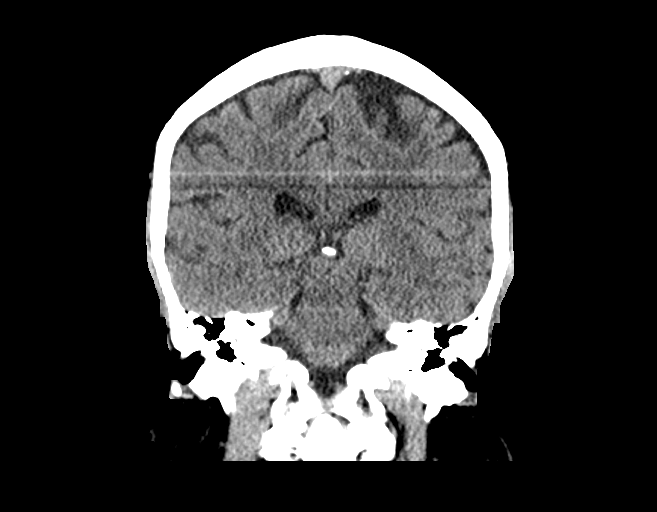
[im 47/84  brain]
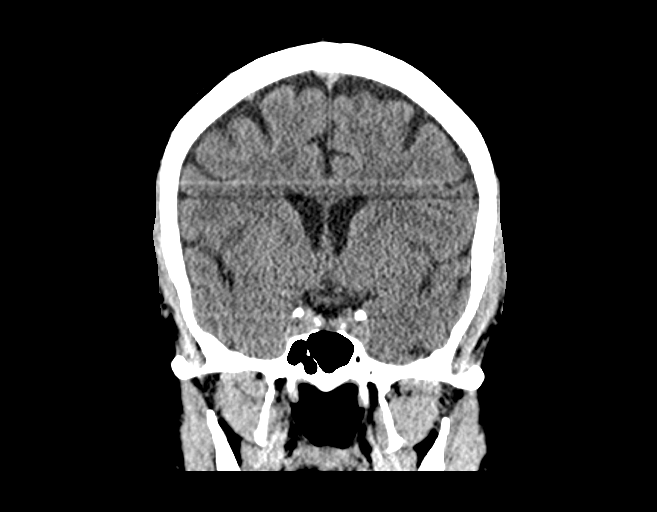

[Series 5: sag soft · sagittal · 0.39mm/px · 3 of 59 slices shown]
[im 20/59  brain]
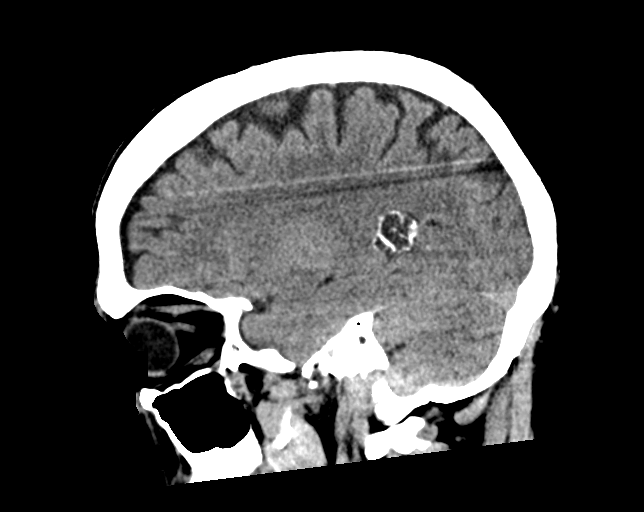
[im 30/59  brain]
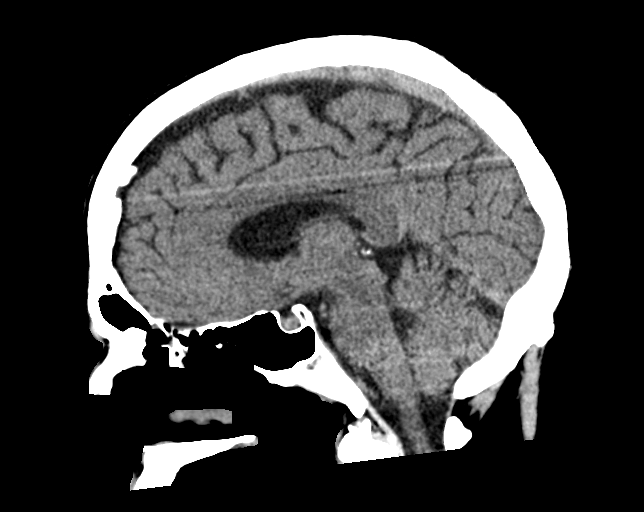
[im 39/59  brain]
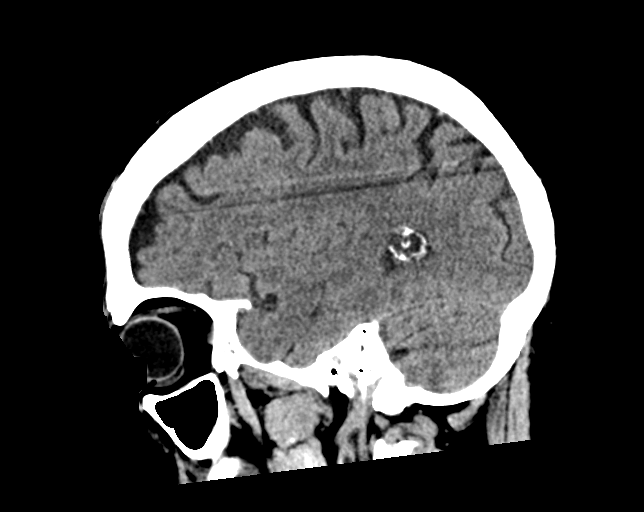

[15 of 47 positions shown; findings below may reference images not displayed]

FINDINGS: Brain: The brain shows a normal appearance without evidence of
malformation, atrophy, old or acute small or large vessel
infarction, mass lesion, hemorrhage, hydrocephalus or extra-axial
collection. Incidental and insignificant cysts of the choroid plexus
of the lateral ventricles.

Vascular: No hyperdense vessel. No evidence of atherosclerotic
calcification.

Skull: Normal.  No traumatic finding.  No focal bone lesion.

Sinuses/Orbits: Sinuses are clear. Orbits appear normal. Mastoids
are clear.

Other: None significant
IMPRESSION: Normal head CT

## 2023-08-26 ENCOUNTER — Other Ambulatory Visit: Payer: Self-pay | Admitting: *Deleted

## 2023-08-26 ENCOUNTER — Ambulatory Visit
Admission: RE | Admit: 2023-08-26 | Discharge: 2023-08-26 | Disposition: A | Discharge status: Home / Self Care | Source: Ambulatory Visit | Attending: *Deleted | Admitting: *Deleted

## 2023-08-26 DIAGNOSIS — R059 Cough, unspecified: Secondary | ICD-10-CM

## 2023-08-27 ENCOUNTER — Inpatient Hospital Stay (HOSPITAL_BASED_OUTPATIENT_CLINIC_OR_DEPARTMENT_OTHER)
Admission: EM | Admit: 2023-08-27 | Discharge: 2023-08-29 | DRG: 280 | Disposition: A | Discharge status: Home / Self Care | Attending: Internal Medicine | Admitting: Internal Medicine

## 2023-08-27 ENCOUNTER — Emergency Department (HOSPITAL_BASED_OUTPATIENT_CLINIC_OR_DEPARTMENT_OTHER)

## 2023-08-27 ENCOUNTER — Other Ambulatory Visit: Payer: Self-pay

## 2023-08-27 ENCOUNTER — Encounter (HOSPITAL_BASED_OUTPATIENT_CLINIC_OR_DEPARTMENT_OTHER): Payer: Self-pay

## 2023-08-27 DIAGNOSIS — Z79899 Other long term (current) drug therapy: Secondary | ICD-10-CM | POA: Diagnosis not present

## 2023-08-27 DIAGNOSIS — I3139 Other pericardial effusion (noninflammatory): Secondary | ICD-10-CM | POA: Diagnosis present

## 2023-08-27 DIAGNOSIS — I5021 Acute systolic (congestive) heart failure: Secondary | ICD-10-CM | POA: Diagnosis not present

## 2023-08-27 DIAGNOSIS — D72829 Elevated white blood cell count, unspecified: Secondary | ICD-10-CM

## 2023-08-27 DIAGNOSIS — I5041 Acute combined systolic (congestive) and diastolic (congestive) heart failure: Secondary | ICD-10-CM | POA: Diagnosis present

## 2023-08-27 DIAGNOSIS — E119 Type 2 diabetes mellitus without complications: Secondary | ICD-10-CM | POA: Diagnosis not present

## 2023-08-27 DIAGNOSIS — I2510 Atherosclerotic heart disease of native coronary artery without angina pectoris: Secondary | ICD-10-CM | POA: Diagnosis present

## 2023-08-27 DIAGNOSIS — E871 Hypo-osmolality and hyponatremia: Secondary | ICD-10-CM | POA: Diagnosis present

## 2023-08-27 DIAGNOSIS — I509 Heart failure, unspecified: Secondary | ICD-10-CM

## 2023-08-27 DIAGNOSIS — I214 Non-ST elevation (NSTEMI) myocardial infarction: Secondary | ICD-10-CM | POA: Diagnosis not present

## 2023-08-27 DIAGNOSIS — E1165 Type 2 diabetes mellitus with hyperglycemia: Secondary | ICD-10-CM | POA: Diagnosis present

## 2023-08-27 HISTORY — DX: Unspecified injury of thorax, initial encounter: S29.9XXA

## 2023-08-27 HISTORY — DX: Type 2 diabetes mellitus without complications: E11.9

## 2023-08-27 LAB — CBC
HCT: 40.7 % (ref 39.0–52.0)
Hemoglobin: 13.8 g/dL (ref 13.0–17.0)
MCH: 30.7 pg (ref 26.0–34.0)
MCHC: 33.9 g/dL (ref 30.0–36.0)
MCV: 90.4 fL (ref 80.0–100.0)
Platelets: 342 10*3/uL (ref 150–400)
RBC: 4.5 MIL/uL (ref 4.22–5.81)
RDW: 12.1 % (ref 11.5–15.5)
WBC: 14.7 10*3/uL — ABNORMAL HIGH (ref 4.0–10.5)
nRBC: 0 % (ref 0.0–0.2)

## 2023-08-27 LAB — BASIC METABOLIC PANEL WITH GFR
Anion gap: 12 (ref 5–15)
BUN: 13 mg/dL (ref 8–23)
CO2: 24 mmol/L (ref 22–32)
Calcium: 10 mg/dL (ref 8.9–10.3)
Chloride: 95 mmol/L — ABNORMAL LOW (ref 98–111)
Creatinine, Ser: 0.93 mg/dL (ref 0.61–1.24)
GFR, Estimated: >60 mL/min (ref >60)
Glucose, Bld: 312 mg/dL — ABNORMAL HIGH (ref 70–99)
Potassium: 4.7 mmol/L (ref 3.5–5.1)
Sodium: 131 mmol/L — ABNORMAL LOW (ref 135–145)

## 2023-08-27 LAB — HEMOGLOBIN A1C
Hgb A1c MFr Bld: 10.6 % — ABNORMAL HIGH (ref 4.8–5.6)
Mean Plasma Glucose: 257.52 mg/dL

## 2023-08-27 LAB — TROPONIN T, HIGH SENSITIVITY
Troponin T High Sensitivity: 497 ng/L (ref <19)
Troponin T High Sensitivity: 504 ng/L (ref <19)

## 2023-08-27 LAB — PRO BRAIN NATRIURETIC PEPTIDE: Pro Brain Natriuretic Peptide: 1460 pg/mL — ABNORMAL HIGH (ref <300.0)

## 2023-08-27 LAB — HEPARIN LEVEL (UNFRACTIONATED): Heparin Unfractionated: <0.1 [IU]/mL — ABNORMAL LOW (ref 0.30–0.70)

## 2023-08-27 LAB — GLUCOSE, CAPILLARY: Glucose-Capillary: 385 mg/dL — ABNORMAL HIGH (ref 70–99)

## 2023-08-27 MED ORDER — HEPARIN (PORCINE) 25000 UT/250ML-% IV SOLN
1050.0000 [IU]/h | INTRAVENOUS | Status: DC
  Administered 2023-08-27: 800 [IU]/h via INTRAVENOUS
  Filled 2023-08-27 (×2): qty 250

## 2023-08-27 MED ORDER — ATORVASTATIN CALCIUM 80 MG PO TABS: 80.0000 mg | ORAL_TABLET | Freq: Every day | ORAL | Status: DC

## 2023-08-27 MED ORDER — ASPIRIN 81 MG PO CHEW
81.0000 mg | CHEWABLE_TABLET | ORAL | Status: AC
  Administered 2023-08-28: 81 mg via ORAL
  Filled 2023-08-27: qty 1

## 2023-08-27 MED ORDER — HEPARIN BOLUS VIA INFUSION
2000.0000 [IU] | Freq: Once | INTRAVENOUS | Status: AC
  Administered 2023-08-27: 2000 [IU] via INTRAVENOUS
  Filled 2023-08-27: qty 2000

## 2023-08-27 MED ORDER — ACETAMINOPHEN 325 MG PO TABS: 650.0000 mg | ORAL_TABLET | Freq: Four times a day (QID) | ORAL | Status: DC | PRN

## 2023-08-27 MED ORDER — INSULIN ASPART 100 UNIT/ML IJ SOLN
0.0000 [IU] | INTRAMUSCULAR | Status: DC
  Administered 2023-08-27: 9 [IU] via SUBCUTANEOUS
  Administered 2023-08-28: 2 [IU] via SUBCUTANEOUS
  Administered 2023-08-28 (×2): 3 [IU] via SUBCUTANEOUS
  Administered 2023-08-28: 5 [IU] via SUBCUTANEOUS

## 2023-08-27 MED ORDER — ASPIRIN 81 MG PO TBEC: 81.0000 mg | DELAYED_RELEASE_TABLET | Freq: Every day | ORAL | Status: DC

## 2023-08-27 MED ORDER — ATORVASTATIN CALCIUM 80 MG PO TABS: 80.0000 mg | ORAL_TABLET | ORAL | Status: DC

## 2023-08-27 MED ORDER — ASPIRIN 81 MG PO CHEW
324.0000 mg | CHEWABLE_TABLET | Freq: Once | ORAL | Status: AC
  Administered 2023-08-27: 324 mg via ORAL
  Filled 2023-08-27: qty 4

## 2023-08-27 MED ORDER — ASPIRIN 81 MG PO TBEC
81.0000 mg | DELAYED_RELEASE_TABLET | Freq: Every day | ORAL | Status: DC
  Administered 2023-08-29: 81 mg via ORAL
  Filled 2023-08-27: qty 1

## 2023-08-27 MED ORDER — HEPARIN BOLUS VIA INFUSION
3900.0000 [IU] | Freq: Once | INTRAVENOUS | Status: AC
  Administered 2023-08-27: 3900 [IU] via INTRAVENOUS

## 2023-08-27 MED ORDER — ATORVASTATIN CALCIUM 80 MG PO TABS
80.0000 mg | ORAL_TABLET | Freq: Every day | ORAL | Status: DC
Start: 2023-08-27 — End: 2023-08-28
  Filled 2023-08-27: qty 1

## 2023-08-27 MED ORDER — SODIUM CHLORIDE 0.9 % IV SOLN: INTRAVENOUS | Status: DC

## 2023-08-27 MED ORDER — FUROSEMIDE 10 MG/ML IJ SOLN
20.0000 mg | Freq: Once | INTRAMUSCULAR | Status: AC
  Administered 2023-08-27: 20 mg via INTRAVENOUS
  Filled 2023-08-27: qty 2

## 2023-08-27 NOTE — ED Notes (Signed)
   08/27/23 0854  Respiratory Assessment  $ RT Protocol Assessment  Yes  Assessment Type Assess only  Respiratory Pattern Regular;Unlabored;Symmetrical  Chest Assessment Chest expansion symmetrical  R Upper  Breath Sounds Clear  L Upper Breath Sounds Clear  R Lower Breath Sounds Clear;Diminished  L Lower Breath Sounds Clear;Diminished  Oxygen Therapy/Pulse Ox  O2 Therapy Room air  SpO2 96 %   Ambulated from lobby to room 5 with pulseox, HR 90-94, SpO2 95%.

## 2023-08-27 NOTE — Plan of Care (Addendum)
 Hospital Medicine Transfer Accept Note Patient Name/Age: Anthony Forbes / 73 y.o. MRN: [id number] Admission Date: 08/27/2023  Once successfully transferred to the appropriate floor, TRH will assume care for the patient above.  A/P: 83M h/o DM2 p/w NSTEMI and ACS r/o. Pt reported to ED w/ 2 weeks of chest heaviness. Labs notable for BNP 1460 and troponin 497-->504. CXR w/ b/l pleural effusions. Consult cards on arrival.  FYI per DM RN: In reviewing chart, noted pt has DM2 hx. A1C was 10.8% on 07/05/22 when he was at [facility] hospital. Pt did not want to start any DM meds at d/c. Lab glucose 312 mg/dl today. Recommendation: Please consider ordering CBGs AC&HS and Novolog 0-15 units TID with meals and Novolog 0-5 units at bedtime. Please consider ordering an A1C to evaluate glycemic control over the past 2-3 months. Outpatient DM: Patient will likely need to be started on DM medication at discharge and asked to follow up with PCP regarding DM control. Thanks, Erby Hatcher

## 2023-08-27 NOTE — Inpatient Diabetes Management (Signed)
 Inpatient Diabetes Program Recommendations  AACE/ADA: New Consensus Statement on Inpatient Glycemic Control  Target Ranges:  Prepandial:   less than 140 mg/dL      Peak postprandial:   less than 180 mg/dL (1-2 hours)      Critically ill patients:  140 - 180 mg/dL    Latest Reference Range & Units 05/11/17 17:21 07/11/18 18:14 08/27/23 08:49  Glucose 70 - 99 mg/dL 409 (H) 811 (H) 914 (H)   Review of Glycemic Control  Diabetes history: DM2 Outpatient Diabetes medications: None Current orders for Inpatient glycemic control: None; in [facility]   Inpatient Diabetes Program Recommendations:    Insulin: Please consider ordering CBGs AC&HS and Novolog 0-15 units TID with meals and Novolog 0-5 units at bedtime.  HbgA1C: Please consider ordering an A1C to evaluate glycemic control over the past 2-3 months. No A1C values in EPIC but noted in Care Everywhere that A1C was 10.8% on 07/05/22.   Outpatient DM: Patient will likely need to be started on DM medication at discharge and asked to follow up with PCP regarding DM control.  NOTE: Per chart, patient at [facility] with chest pain and will be transferred for admission. Lab glucose 312 mg/dl today.  In reviewing chart, noted in Care Everywhere that patient was inpatient at [facility] 07/05/22-07/07/22 and A1C was 10.8% at that time. Per notes, patient had been controlling DM with diet, exercise and OTC supplements and did not want to start any DM medication outpatient. Patient was ordered Lantus 18 units at bedtime, Humalog 6 units TID with meals plus Humalog 0-12 units TID while inpatient for glycemic control.    Thanks, Beacher Limerick, RN, MSN, CDCES Diabetes Coordinator Inpatient Diabetes Program [phone] (Team Pager from 8am to 5pm)

## 2023-08-27 NOTE — Plan of Care (Addendum)
 Pt resting throughout shift. Aox4, VSS on room air. Pt c/o mild right shoulder pain. NPO since midnight. Voiding w/o difficulty. CHG bath done. Heparin gtt paused, NS running KVO. Safety precautions in place, call light within reach. Family at bedside.    Problem: Education: Goal: Knowledge of General Education information will improve Description: Including pain rating scale, medication(s)/side effects and non-pharmacologic comfort measures Outcome: Progressing   Problem: Health Behavior/Discharge Planning: Goal: Ability to manage health-related needs will improve Outcome: Progressing   Problem: Clinical Measurements: Goal: Ability to maintain clinical measurements within normal limits will improve Outcome: Progressing Goal: Will remain free from infection Outcome: Progressing Goal: Diagnostic test results will improve Outcome: Progressing Goal: Respiratory complications will improve Outcome: Progressing Goal: Cardiovascular complication will be avoided Outcome: Progressing   Problem: Activity: Goal: Risk for activity intolerance will decrease Outcome: Progressing   Problem: Nutrition: Goal: Adequate nutrition will be maintained Outcome: Progressing   Problem: Coping: Goal: Level of anxiety will decrease Outcome: Progressing   Problem: Elimination: Goal: Will not experience complications related to bowel motility Outcome: Progressing Goal: Will not experience complications related to urinary retention Outcome: Progressing   Problem: Pain Managment: Goal: General experience of comfort will improve and/or be controlled Outcome: Progressing   Problem: Safety: Goal: Ability to remain free from injury will improve Outcome: Progressing   Problem: Skin Integrity: Goal: Risk for impaired skin integrity will decrease Outcome: Progressing   Problem: Education: Goal: Ability to describe self-care measures that may prevent or decrease complications (Diabetes Survival  Skills Education) will improve Outcome: Progressing Goal: Individualized Educational Video(s) Outcome: Progressing   Problem: Coping: Goal: Ability to adjust to condition or change in health will improve Outcome: Progressing   Problem: Fluid Volume: Goal: Ability to maintain a balanced intake and output will improve Outcome: Progressing   Problem: Health Behavior/Discharge Planning: Goal: Ability to identify and utilize available resources and services will improve Outcome: Progressing Goal: Ability to manage health-related needs will improve Outcome: Progressing   Problem: Metabolic: Goal: Ability to maintain appropriate glucose levels will improve Outcome: Progressing   Problem: Nutritional: Goal: Maintenance of adequate nutrition will improve Outcome: Progressing Goal: Progress toward achieving an optimal weight will improve Outcome: Progressing   Problem: Skin Integrity: Goal: Risk for impaired skin integrity will decrease Outcome: Progressing   Problem: Tissue Perfusion: Goal: Adequacy of tissue perfusion will improve Outcome: Progressing   Problem: Education: Goal: Understanding of CV disease, CV risk reduction, and recovery process will improve Outcome: Progressing Goal: Individualized Educational Video(s) Outcome: Progressing   Problem: Activity: Goal: Ability to return to baseline activity level will improve Outcome: Progressing   Problem: Cardiovascular: Goal: Ability to achieve and maintain adequate cardiovascular perfusion will improve Outcome: Progressing Goal: Vascular access site(s) Level 0-1 will be maintained Outcome: Progressing   Problem: Health Behavior/Discharge Planning: Goal: Ability to safely manage health-related needs after discharge will improve Outcome: Progressing

## 2023-08-27 NOTE — Progress Notes (Signed)
 PHARMACY - ANTICOAGULATION CONSULT NOTE  Pharmacy Consult for heparin  Indication: chest pain/ACS  No Known Allergies  Patient Measurements: Height: 5\' 8"  (172.7 cm) Weight: 69 kg (152 lb 1.9 oz) IBW/kg (Calculated) : 68.4 HEPARIN  DW (KG): 69  Vital Signs: Temp: 99.5 F (37.5 C) (05/22 1954) Temp Source: Oral (05/22 1954) BP: 141/76 (05/22 1954) Pulse Rate: 85 (05/22 1954)  Labs: Recent Labs    08/27/23 0849 08/27/23 1938  HGB 13.8  --   HCT 40.7  --   PLT 342  --   HEPARINUNFRC  --  <0.10*  CREATININE 0.93  --     Estimated Creatinine Clearance: 70.5 mL/min (by C-G formula based on SCr of 0.93 mg/dL).   Medical History: Past Medical History:  Diagnosis Date   Diabetes mellitus without complication (HCC)    Rib injury     Medications:  Infusions:   [START ON 08/28/2023] sodium chloride      heparin  800 Units/hr (08/27/23 1017)    Assessment: 72 yom presented to the ED with CP and SOB. To start IV heparin . Baseline CBC is WNL and he is not on anticoagulation PTA.   Heparin  level undetectable on infusion at 800 units/hr. No issues with line or bleeding reported per RN.  Goal of Therapy:  Heparin  level 0.3-0.7 units/ml Monitor platelets by anticoagulation protocol: Yes   Plan:  Rebolus heparin  2000 units Increase heparin  gtt to 1050 units/hr Check an 8 hr heparin  level  Enrigue Harvard, PharmD, BCPS Please see amion for complete clinical pharmacist phone list 08/27/2023,9:19 PM

## 2023-08-27 NOTE — ED Triage Notes (Signed)
 Pt family reports that the patient had a chest xray yesterday and it showed bilateral pleural effusions. Reports chest pain and SOB. States that the NP told them to come to ED for further evaluation. States that he has been on antibiotic therapy and nothing seems to be helping. Respiratory walked pt and oxygen upon waling to room from triage area. Pt did take antibiotic and breathing treatment.

## 2023-08-27 NOTE — H&P (Signed)
 History and Physical    Anthony Forbes ZOX:[id number] DOB: [birth year] DOA: 08/27/2023  PCP: Norrine Bedford, CRNP  Patient coming from: [facility] ED  Chief Complaint: Chest pain  HPI: Anthony Forbes is a 72 y.o. male with medical history significant of type 2 diabetes presented to ED due to concern for chest pain/heaviness x 2 weeks, fatigue, and pleural effusions on outpatient chest x-ray.  Vital signs stable on arrival.  Labs notable for WBC count 14.7, sodium 131, chloride 95, glucose 312, bicarb 24, anion gap 12, proBNP 1460, troponin 497> 504.  Chest x-ray showing small bilateral pleural effusions with bibasilar atelectasis.  EKG showing sinus rhythm with inferior Q waves and about 1 mm ST elevation as well as T wave inversions in leads V4-V6 suggesting recent inferior infarct. ED physician discussed with cardiologist who reviewed the EKG and felt that it did not need STEMI criteria.  Patient was given IV Lasix 20 mg, aspirin 324 mg, and started on IV heparin.  Cardiology will consult.  Patient is reporting 2-week history of exertional chest heaviness.  Denies any chest heaviness or discomfort at this time.  Denies cough or shortness of breath.  States he was recently treated for pneumonia with azithromycin.  States he had a chest x-ray done yesterday and was told there was fluid in his lungs.  He does not take any medications for diabetes.  No other complaints.  Review of Systems:  Review of Systems  All other systems reviewed and are negative.   Past Medical History:  Diagnosis Date   Diabetes mellitus without complication (HCC)    Rib injury     Past Surgical History:  Procedure Laterality Date   COSMETIC SURGERY       reports that he has never smoked. He has never used smokeless tobacco. He reports current alcohol use of about 2.0 - 3.0 standard drinks of alcohol per week. He reports that he does not use drugs.  No Known Allergies  History reviewed. No pertinent family  history.  Prior to Admission medications   Medication Sig Start Date End Date Taking? Authorizing Provider  doxycycline  (VIBRA -TABS) 100 MG tablet Take 1 tablet (100 mg total) by mouth 2 (two) times daily. 04/18/15   Kandy Orris, MD    Physical Exam: Vitals:   08/27/23 1700 08/27/23 1715 08/27/23 1716 08/27/23 1845  BP: 121/70 122/89  132/81  Pulse: 84 84 86 86  Resp:  20 17 18   Temp:  98.4 F (36.9 C)  98.4 F (36.9 C)  TempSrc:  Oral  Oral  SpO2: 91% 96% 93% 95%  Weight:  69 kg    Height:  5\' 8"  (1.727 m)      Physical Exam Vitals reviewed.  Constitutional:      General: He is not in acute distress. HENT:     Head: Normocephalic and atraumatic.  Eyes:     Extraocular Movements: Extraocular movements intact.  Cardiovascular:     Rate and Rhythm: Normal rate and regular rhythm.     Pulses: Normal pulses.  Pulmonary:     Effort: Pulmonary effort is normal. No respiratory distress.     Breath sounds: No wheezing or rales.  Abdominal:     General: Bowel sounds are normal. There is no distension.     Palpations: Abdomen is soft.     Tenderness: There is no abdominal tenderness. There is no guarding.  Musculoskeletal:     Cervical back: Normal range of motion.  Right lower leg: No edema.     Left lower leg: No edema.  Skin:    General: Skin is warm and dry.  Neurological:     General: No focal deficit present.     Mental Status: He is alert and oriented to person, place, and time.     Labs on Admission: I have personally reviewed following labs and imaging studies  CBC: Recent Labs  Lab 08/27/23 0849  WBC 14.7*  HGB 13.8  HCT 40.7  MCV 90.4  PLT 342   Basic Metabolic Panel: Recent Labs  Lab 08/27/23 0849  NA 131*  K 4.7  CL 95*  CO2 24  GLUCOSE 312*  BUN 13  CREATININE 0.93  CALCIUM 10.0   GFR: Estimated Creatinine Clearance: 70.5 mL/min (by C-G formula based on SCr of 0.93 mg/dL). Liver Function Tests: No results for input(s): "AST",  "ALT", "ALKPHOS", "BILITOT", "PROT", "ALBUMIN" in the last 168 hours. No results for input(s): "LIPASE", "AMYLASE" in the last 168 hours. No results for input(s): "AMMONIA" in the last 168 hours. Coagulation Profile: No results for input(s): "INR", "PROTIME" in the last 168 hours. Cardiac Enzymes: No results for input(s): "CKTOTAL", "CKMB", "CKMBINDEX", "TROPONINI" in the last 168 hours. BNP (last 3 results) Recent Labs    08/27/23 0849  PROBNP 1,460.0*   HbA1C: No results for input(s): "HGBA1C" in the last 72 hours. CBG: No results for input(s): "GLUCAP" in the last 168 hours. Lipid Profile: No results for input(s): "CHOL", "HDL", "LDLCALC", "TRIG", "CHOLHDL", "LDLDIRECT" in the last 72 hours. Thyroid Function Tests: No results for input(s): "TSH", "T4TOTAL", "FREET4", "T3FREE", "THYROIDAB" in the last 72 hours. Anemia Panel: No results for input(s): "VITAMINB12", "FOLATE", "FERRITIN", "TIBC", "IRON", "RETICCTPCT" in the last 72 hours. Urine analysis: No results found for: "COLORURINE", "APPEARANCEUR", "LABSPEC", "PHURINE", "GLUCOSEU", "HGBUR", "BILIRUBINUR", "KETONESUR", "PROTEINUR", "UROBILINOGEN", "NITRITE", "LEUKOCYTESUR"  Radiological Exams on Admission: DG Chest 2 View Result Date: 08/27/2023 CLINICAL DATA:  Chest pain. EXAM: CHEST - 2 VIEW COMPARISON:  Chest radiograph dated 08/26/2023. FINDINGS: Small bilateral pleural effusions with bibasilar atelectasis. Pneumonia is not excluded. No pneumothorax. Stable cardiac silhouette. No acute osseous pathology. Degenerative changes of the spine. IMPRESSION: Small bilateral pleural effusions with bibasilar atelectasis. Electronically Signed   By: Angus Bark M.D.   On: 08/27/2023 09:27   DG Chest 2 View Result Date: 08/26/2023 CLINICAL DATA:  Cough.  Chest tightness. EXAM: CHEST - 2 VIEW COMPARISON:  Chest radiograph dated 04/18/2015. FINDINGS: Small bilateral pleural effusions and bibasilar atelectasis. No focal consolidation or  pneumothorax. The cardiac silhouette is within normal limits. Age indeterminate, old-appearing bilateral rib fractures. Clinical correlation is recommended. IMPRESSION: Small bilateral pleural effusions and bibasilar atelectasis. Electronically Signed   By: Angus Bark M.D.   On: 08/26/2023 10:31    Assessment and Plan  NSTEMI Patient presenting with 2-week history of exertional chest heaviness.  Currently asymptomatic.  Troponin 497> 504. EKG showing sinus rhythm with inferior Q waves and about 1 mm ST elevation as well as T wave inversions in leads V4-V6 suggesting recent inferior infarct.  Cardiologist reviewed the EKG and felt that it did not meet STEMI criteria.  Cardiology consulting, appreciate recommendations.  Continue cardiac monitoring, IV heparin, aspirin, and atorvastatin.  Fasting lipid panel in the morning.  Plan for left and right heart catheterization tomorrow.  Acute CHF proBNP 1460 and chest x-ray showing small bilateral pleural effusions.  Patient is not hypoxic.  He was given IV Lasix 20 mg in the ED.  Cardiology  recommending holding off on additional diuretics tonight and obtaining echocardiogram in the morning.  Plan for right heart cath tomorrow in addition to left heart cath to assess volume status and cardiac output.  He will subsequently be placed on guideline directed medical therapy for heart failure after catheterization.  Poorly controlled type 2 diabetes Last A1c 10.8 in March 2024 and patient is currently not on any medications at home.  Glucose 312 without signs of DKA.  Repeat A1c and placed on sensitive sliding scale insulin every 4 hours for now as patient will be n.p.o. after midnight in anticipation of heart catheterization tomorrow.  Given heart failure, would benefit from starting SGLT2 inhibitor prior to discharge.  Mild leukocytosis Patient recently treated with outpatient antibiotic for pneumonia.  No infectious signs or symptoms at this time.  Continue  to monitor WBC count.  DVT prophylaxis: IV heparin gtt Code Status: Full Code (discussed with the patient) Family Communication: Daughter and granddaughter at bedside. Consults called: Cardiology Admission status: It is my clinical opinion that admission to INPATIENT is reasonable and necessary because of the expectation that this patient will require hospital care that crosses at least 2 midnights to treat this condition based on the medical complexity of the problems presented.  Given the aforementioned information, the predictability of an adverse outcome is felt to be significant.  Juliette Oh MD [facility]  If 7PM-7AM, please contact night-coverage [email or web address]  08/27/2023, 7:24 PM

## 2023-08-27 NOTE — Consult Note (Signed)
 CONSULTATION NOTE   Patient Name: Anthony Forbes Date of Encounter: 08/27/2023 Cardiologist: None Electrophysiologist: None Advanced Heart Failure: None   Chief Complaint   Chest pain, DOE  Patient Profile   73 yo male with DM2, presented with chest pain, fatigue/dyspnea and found to have pleural effusions on x-ray, troponin elevated - admitted for NSTEMI and CHF.  HPI   Anthony Forbes is a 73 y.o. male who is being seen today for the evaluation of chest pain, dyspnea and pleural effusions at the request of Dr. Aldean Amass. This is a male patient with a history of type 2 diabetes but no known coronary artery disease history.  He has not been followed by cardiology that I can tell.  He presented to [facility] with chest pain, fatigue and shortness of breath.  He has had chest heaviness about 2 weeks ago and then sought out medical care due to wheezing and congestion.  He was started on azithromycin.  He seemed to feel little bit better but became weaker and fatigued.  He was again seen by his primary care provider and put on doxycycline .  He had a chest x-ray that showed pleural effusions and he was noted to have a low normal oxygen saturation 92%.  He was brought to the emergency department.  EKG showed sinus rhythm with inferior Q waves and about 1 mm ST segment elevation as well as T wave inversions in leads V4 through V6 suggesting recent inferior infarct.  Chest x-ray demonstrates small bilateral pleural effusions.  High-sensitivity troponin was abnormal at 497 and 504.  proBNP is elevated 1460.  Heart rate and blood pressure normal.  Labs were notable for leukocytosis with a white count of 14,700.  He is also hyponatremic with a sodium of 131 and chloride of 95.  He was given 20 mg IV Lasix and started on heparin and administered 324 mg of aspirin.  He was subsequently transferred to hospital medicine service and cardiology was consulted for recommendations of  management.  PMHx   Past Medical History:  Diagnosis Date   Diabetes mellitus without complication (HCC)    Rib injury     Past Surgical History:  Procedure Laterality Date   COSMETIC SURGERY      FAMHx   History reviewed. No pertinent family history.  SOCHx    reports that he has never smoked. He has never used smokeless tobacco. He reports current alcohol use of about 2.0 - 3.0 standard drinks of alcohol per week. He reports that he does not use drugs.  Outpatient Medications   No current facility-administered medications on file prior to encounter.   Current Outpatient Medications on File Prior to Encounter  Medication Sig Dispense Refill   doxycycline  (VIBRA -TABS) 100 MG tablet Take 1 tablet (100 mg total) by mouth 2 (two) times daily. 20 tablet 0    Inpatient Medications    Scheduled Meds:   Continuous Infusions:  heparin 800 Units/hr (08/27/23 1017)    PRN Meds:    ALLERGIES   No Known Allergies  ROS   Pertinent items noted in HPI and remainder of comprehensive ROS otherwise negative.  Vitals   Vitals:   08/27/23 1700 08/27/23 1715 08/27/23 1716 08/27/23 1845  BP: 121/70 122/89  132/81  Pulse: 84 84 86 86  Resp:  20 17 18   Temp:  98.4 F (36.9 C)  98.4 F (36.9 C)  TempSrc:  Oral  Oral  SpO2: 91% 96% 93% 95%  Weight:  69 kg    Height:  5\' 8"  (1.727 m)     No intake or output data in the 24 hours ending 08/27/23 1924 Filed Weights   08/27/23 0839 08/27/23 1715  Weight: 64.8 kg 69 kg    Physical Exam   General appearance: alert, no distress, and thin Neck: no carotid bruit, no JVD, and thyroid not enlarged, symmetric, no tenderness/mass/nodules Lungs: clear to auscultation bilaterally Heart: regular rate and rhythm Abdomen: soft, non-tender; bowel sounds normal; no masses,  no organomegaly Extremities: extremities normal, atraumatic, no cyanosis or edema Pulses: 2+ and symmetric Skin: Skin color, texture, turgor normal. No rashes  or lesions Neurologic: Grossly normal Psych: Pleasant  Labs   Results for orders placed or performed during the hospital encounter of 08/27/23 (from the past 48 hours)  Basic metabolic panel     Status: Abnormal   Collection Time: 08/27/23  8:49 AM  Result Value Ref Range   Sodium 131 (L) 135 - 145 mmol/L   Potassium 4.7 3.5 - 5.1 mmol/L   Chloride 95 (L) 98 - 111 mmol/L   CO2 24 22 - 32 mmol/L   Glucose, Bld 312 (H) 70 - 99 mg/dL    Comment: Glucose reference range applies only to samples taken after fasting for at least 8 hours.   BUN 13 8 - 23 mg/dL   Creatinine, Ser 0.45 0.61 - 1.24 mg/dL   Calcium 40.9 8.9 - 81.1 mg/dL   GFR, Estimated >91 >47 mL/min    Comment: (NOTE) Calculated using the CKD-EPI Creatinine Equation (2021)    Anion gap 12 5 - 15    Comment: Performed at [facility], [address] [place], [place] [address]  CBC     Status: Abnormal   Collection Time: 08/27/23  8:49 AM  Result Value Ref Range   WBC 14.7 (H) 4.0 - 10.5 K/uL   RBC 4.50 4.22 - 5.81 MIL/uL   Hemoglobin 13.8 13.0 - 17.0 g/dL   HCT 21.3 08.6 - 57.8 %   MCV 90.4 80.0 - 100.0 fL   MCH 30.7 26.0 - 34.0 pg   MCHC 33.9 30.0 - 36.0 g/dL   RDW 46.9 62.9 - 52.8 %   Platelets 342 150 - 400 K/uL   nRBC 0.0 0.0 - 0.2 %    Comment: Performed at [facility], [address] [place], [place] [address]  Troponin T, High Sensitivity     Status: Abnormal   Collection Time: 08/27/23  8:49 AM  Result Value Ref Range   Troponin T High Sensitivity 497 (HH) <19 ng/L    Comment: Critical Value, Read Back and verified with MARVA SIMMS AT 0950 08/27/23 MARTINB (NOTE) Biotin concentrations > 1000 ng/mL falsely decrease TnT results.  Serial cardiac troponin measurements are suggested.  Refer to the Links section for chest pain algorithms and additional  guidance. Performed at [facility], [address] [place], [place] [address]   Pro Brain natriuretic peptide      Status: Abnormal   Collection Time: 08/27/23  8:49 AM  Result Value Ref Range   Pro Brain Natriuretic Peptide 1,460.0 (H) <300.0 pg/mL    Comment: (NOTE) Age Group        Cut-Points    Interpretation  < 50 years     450 pg/mL       NT-proBNP > 450 pg/mL indicates  ADHF is likely              50 to 75 years  900 pg/mL      NT-proBNP > 900 pg/mL indicates          ADHF is likely  > 75 years      1800 pg/mL     NT-proBNP > 1800 pg/mL indicates          ADHF is likely                           All ages    Results between       Indeterminate. Further clinical             300 and the cut-   information is needed to determine            point for age group   if ADHF is present.                                                             Elecsys proBNP II/ Elecsys proBNP II STAT           Cut-Point                       Interpretation  300 pg/mL                    NT-proBNP <300pg/mL indicates                             ADHF is not likely  Performed at [facility], [address] [place], [place] [address]   Troponin T, High Sensitivity     Status: Abnormal   Collection Time: 08/27/23 11:28 AM  Result Value Ref Range   Troponin T High Sensitivity 504 (HH) <19 ng/L    Comment: Critical Value, Read Back and verified with M. SIMMS RN AT 08/27/23 1213 MB (NOTE) Biotin concentrations > 1000 ng/mL falsely decrease TnT results.  Serial cardiac troponin measurements are suggested.  Refer to the Links section for chest pain algorithms and additional  guidance. Performed at [facility] [address] [place], [place] [address]     ECG   Normal sinus rhythm with less than 1 mm inferior ST elevation and inferior Q waves in leads II, III and aVF as well as T wave inversions in V4 through V6 suggesting probable recent inferior STEMI- Personally Reviewed  Telemetry   Sinus rhythm- Personally Reviewed  Radiology   DG Chest 2  View Result Date: 08/27/2023 CLINICAL DATA:  Chest pain. EXAM: CHEST - 2 VIEW COMPARISON:  Chest radiograph dated 08/26/2023. FINDINGS: Small bilateral pleural effusions with bibasilar atelectasis. Pneumonia is not excluded. No pneumothorax. Stable cardiac silhouette. No acute osseous pathology. Degenerative changes of the spine. IMPRESSION: Small bilateral pleural effusions with bibasilar atelectasis. Electronically Signed   By: Angus Bark M.D.   On: 08/27/2023 09:27   DG Chest 2 View Result Date: 08/26/2023 CLINICAL DATA:  Cough.  Chest tightness. EXAM: CHEST - 2 VIEW COMPARISON:  Chest radiograph dated 04/18/2015. FINDINGS: Small bilateral pleural effusions and bibasilar atelectasis. No focal consolidation  or pneumothorax. The cardiac silhouette is within normal limits. Age indeterminate, old-appearing bilateral rib fractures. Clinical correlation is recommended. IMPRESSION: Small bilateral pleural effusions and bibasilar atelectasis. Electronically Signed   By: Angus Bark M.D.   On: 08/26/2023 10:31    Cardiac Studies   N/A  Impression   Principal Problem:   Congestive heart failure (CHF) (HCC) Active Problems:   Non-ST elevation (NSTEMI) myocardial infarction (HCC)   DM2 (diabetes mellitus, type 2) (HCC)   Recommendation   Acute congestive heart failure Anthony Forbes presented with fatigue but little shortness of breath.  He has had a nonproductive cough.  BNP is significantly elevated although he does not appear overtly volume overloaded.  He was given 20 mg of Lasix and PE quite a bit and his jugular veins are not distended.  Extremities are warm and well-perfused.  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  Check 2D echo tomorrow. Will hold off on additional diuretics tonight.  I will plan right heart cath tomorrow in addition to left heart cath to assess volume status and cardiac output.  He will subsequently be placed on guideline directed medical therapy for heart failure  after catheterization.  NSTEMI (or possible missed out of hospital STEMI) Suspect he had a missed STEMI including in the event of significant chest pain about 2 weeks ago as his EKG shows inferior Q waves with mild ST elevation and lateral T wave inversions.  He is asymptomatic at this time.  Continue IV heparin and aspirin. Add atorvastatin 80 mg at bedtime. Check fasting lipid and LP(a) tomorrow am. Plan for left and right heart catheterization tomorrow.  Informed Consent   Shared Decision Making/Informed Consent The risks [stroke (1 in 1000), death (1 in 1000), kidney failure [usually temporary] (1 in 500), bleeding (1 in 200), allergic reaction [possibly serious] (1 in 200)], benefits (diagnostic support and management of coronary artery disease) and alternatives of a cardiac catheterization were discussed in detail with Anthony Forbes and he is willing to proceed.    DM2 Random blood glucose elevated in the 300s. Most recent hemoglobin A1c was 10.8% in 2024, will repeat that as well.  Management per hospital medicine however given heart failure would strongly suggest adding an SGLT2 inhibitor.  Thanks for the consultation. Cardiology will follow with you.  Time Spent Directly with Patient:  I have spent a total of 45 minutes with the patient reviewing hospital notes, telemetry, EKGs, labs and examining the patient as well as establishing an assessment and plan that was discussed personally with the patient.  > 50% of time was spent in direct patient care.  Length of Stay:  LOS: 0 days   Hazle Lites, MD, [facility], FNLA, FACP  [place]  [facility]  Medical Director of the Advanced Lipid Disorders &  Cardiovascular Risk Reduction Clinic Diplomate of the American Board of Clinical Lipidology Attending Cardiologist  Direct Dial: [phone]  Fax: [phone]  Website:  [email or web address] 08/27/2023, 7:24 PM

## 2023-08-27 NOTE — Progress Notes (Signed)
 New patient arrived to room 25 from [facility]. Patient is a/ x4. Mae's x4. Patient is on room air. MP shows NSR. Patient is on a heparin drip at 21ml/hr. Patient states he is here for a cardiac cath for tomorrow. No family with patient at this time. Skin without issues.

## 2023-08-27 NOTE — ED Notes (Signed)
 Delay explained to pt and spouse

## 2023-08-27 NOTE — ED Provider Notes (Signed)
  EMERGENCY DEPARTMENT AT [facility] Provider Note   CSN: [id number] Arrival date & time: 08/27/23  0831     History  Chief Complaint  Patient presents with   Chest Pain    Anthony Forbes is a 73 y.o. male.  HPI 73 year old male presents with concern for chest pain, fatigue, and pleural effusions on x-ray.  Patient has been overall feeling poorly for a few weeks.  A little over 2 weeks ago he had a significant episode of heaviness to his chest.  After that he sought medical care and due to concern for congestion and wheezing was put on a course of azithromycin.  He felt a lot better though still was overall tired and weak.  He has been having on and off chest heaviness and continues to feel overall tired.  The chest heaviness does not have any obvious cause, such as exertion, deep inspiration, etc.  He has never had a cough.  Started feeling worse again and has been put on doxycycline  by his PCP, started it a couple days ago.  Had some blood work that was reassuring and got an x-ray yesterday and his doctor told him to come to the ER due to some pleural effusions.  He was noted to have O2 sats around 92% this morning and was brought in here.  Last episode of chest heaviness/pain was yesterday, right now feels overall tired but no complaints of pain or dyspnea.  No leg swelling.  Home Medications Prior to Admission medications   Medication Sig Start Date End Date Taking? Authorizing Provider  doxycycline  (VIBRA -TABS) 100 MG tablet Take 1 tablet (100 mg total) by mouth 2 (two) times daily. 04/18/15   Kandy Orris, MD      Allergies    Patient has no known allergies.    Review of Systems   Review of Systems  Constitutional:  Positive for fatigue.  Respiratory:  Positive for shortness of breath and wheezing. Negative for cough.   Cardiovascular:  Positive for chest pain. Negative for leg swelling.    Physical Exam Updated Vital Signs BP 129/73   Pulse 85    Temp 98.5 F (36.9 C)   Resp (!) 22   Ht 5\' 8"  (1.727 m)   Wt 64.8 kg   SpO2 96%   BMI 21.71 kg/m  Physical Exam Vitals and nursing note reviewed.  Constitutional:      General: He is not in acute distress.    Appearance: He is well-developed. He is not ill-appearing or diaphoretic.  HENT:     Head: Normocephalic and atraumatic.  Cardiovascular:     Rate and Rhythm: Normal rate and regular rhythm.     Heart sounds: Normal heart sounds.  Pulmonary:     Effort: Pulmonary effort is normal.     Breath sounds: Normal breath sounds.  Abdominal:     Palpations: Abdomen is soft.     Tenderness: There is no abdominal tenderness.  Musculoskeletal:     Right lower leg: No edema.     Left lower leg: No edema.  Skin:    General: Skin is warm and dry.  Neurological:     Mental Status: He is alert.     ED Results / Procedures / Treatments   Labs (all labs ordered are listed, but only abnormal results are displayed) Labs Reviewed  BASIC METABOLIC PANEL WITH GFR - Abnormal; Notable for the following components:      Result Value  Sodium 131 (*)    Chloride 95 (*)    Glucose, Bld 312 (*)    All other components within normal limits  CBC - Abnormal; Notable for the following components:   WBC 14.7 (*)    All other components within normal limits  PRO BRAIN NATRIURETIC PEPTIDE - Abnormal; Notable for the following components:   Pro Brain Natriuretic Peptide 1,460.0 (*)    All other components within normal limits  TROPONIN T, HIGH SENSITIVITY - Abnormal; Notable for the following components:   Troponin T High Sensitivity 497 (*)    All other components within normal limits  HEPARIN LEVEL (UNFRACTIONATED)  TROPONIN T, HIGH SENSITIVITY    EKG EKG Interpretation Date/Time:  Thursday Aug 27 2023 10:00:27 EDT Ventricular Rate:  82 PR Interval:  164 QRS Duration:  109 QT Interval:  415 QTC Calculation: 485 R Axis:   51  Text Interpretation: Sinus rhythm Inferior infarct,  recent Lateral leads are also involved no significant change since earlier in the day Confirmed by Jerilynn Montenegro [id number]) on 08/27/2023 10:03:15 AM  Radiology DG Chest 2 View Result Date: 08/27/2023 CLINICAL DATA:  Chest pain. EXAM: CHEST - 2 VIEW COMPARISON:  Chest radiograph dated 08/26/2023. FINDINGS: Small bilateral pleural effusions with bibasilar atelectasis. Pneumonia is not excluded. No pneumothorax. Stable cardiac silhouette. No acute osseous pathology. Degenerative changes of the spine. IMPRESSION: Small bilateral pleural effusions with bibasilar atelectasis. Electronically Signed   By: Angus Bark M.D.   On: 08/27/2023 09:27   DG Chest 2 View Result Date: 08/26/2023 CLINICAL DATA:  Cough.  Chest tightness. EXAM: CHEST - 2 VIEW COMPARISON:  Chest radiograph dated 04/18/2015. FINDINGS: Small bilateral pleural effusions and bibasilar atelectasis. No focal consolidation or pneumothorax. The cardiac silhouette is within normal limits. Age indeterminate, old-appearing bilateral rib fractures. Clinical correlation is recommended. IMPRESSION: Small bilateral pleural effusions and bibasilar atelectasis. Electronically Signed   By: Angus Bark M.D.   On: 08/26/2023 10:31    Procedures .Critical Care  Performed by: Jerilynn Montenegro, MD Authorized by: Jerilynn Montenegro, MD   Critical care provider statement:    Critical care time (minutes):  35   Critical care time was exclusive of:  Separately billable procedures and treating other patients   Critical care was necessary to treat or prevent imminent or life-threatening deterioration of the following conditions:  Cardiac failure   Critical care was time spent personally by me on the following activities:  Development of treatment plan with patient or surrogate, discussions with consultants, evaluation of patient's response to treatment, examination of patient, ordering and review of laboratory studies, ordering and review of radiographic  studies, ordering and performing treatments and interventions, pulse oximetry, re-evaluation of patient's condition and review of old charts     Medications Ordered in ED Medications  heparin ADULT infusion 100 units/mL (25000 units/250mL) (800 Units/hr Intravenous New Bag/Given 08/27/23 1017)  aspirin chewable tablet 324 mg (324 mg Oral Given 08/27/23 0912)  furosemide (LASIX) injection 20 mg (20 mg Intravenous Given 08/27/23 1006)  heparin bolus via infusion 3,900 Units (3,900 Units Intravenous Bolus from Bag 08/27/23 1018)    ED Course/ Medical Decision Making/ A&P Clinical Course as of 08/27/23 1022  Thu Aug 27, 2023  0905 I discussed case with cardiology, Dr. Alda Amas.  There are some minimal elevations inferiorly but not meeting STEMI criteria.  However with patient being asymptomatic, this probably represents a more recent injury rather than current ischemia.  Recommend serial ECGs and see how his workup  is.  Can consult back but at this time do not activate STEMI. [SG]  1001 I discussed again with Dr. Abelina Hoes.  Patient remains pain-free.  Will start heparin, give IV Lasix, and admit to the hospitalist and cardiology will consult.  Likely will need cath but probably not today given he is pain-free. [SG]    Clinical Course User Index [SG] Jerilynn Montenegro, MD                                 Medical Decision Making Amount and/or Complexity of Data Reviewed Labs: ordered.    Details: Troponin near 500. Radiology: ordered and independent interpretation performed.    Details: Pleural effusions ECG/medicine tests: ordered and independent interpretation performed.    Details: Inferior Q waves, no dynamic changes on multiple ECGs.  Risk OTC drugs. Prescription drug management. Decision regarding hospitalization.   I suspect patient had an MI within the last couple weeks as indicated by his Q waves inferiorly and his history.  He is currently asymptomatic and has not had any chest pain  anytime I ask him during his ED visit.  He does appear to have also have some new onset CHF and will be given a dose of Lasix.  He is not distressed.  His vitals are actually reassuring.  Will put on heparin after discussion with cardiology as above.  He was given aspirin as well.  Discussed case with the hospitalist, Dr. Sulema Endo, who will admit.        Final Clinical Impression(s) / ED Diagnoses Final diagnoses:  NSTEMI (non-ST elevated myocardial infarction) (HCC)  New onset of congestive heart failure (HCC)    Rx / DC Orders ED Discharge Orders     None         Jerilynn Montenegro, MD 08/27/23 1023

## 2023-08-27 NOTE — Progress Notes (Signed)
 PHARMACY - ANTICOAGULATION CONSULT NOTE  Pharmacy Consult for heparin Indication: chest pain/ACS  No Known Allergies  Patient Measurements: Height: 5\' 8"  (172.7 cm) Weight: 64.8 kg (142 lb 12.8 oz) IBW/kg (Calculated) : 68.4 HEPARIN DW (KG): 64.8  Vital Signs: Temp: 98.5 F (36.9 C) (05/22 1000) Temp Source: Oral (05/22 0841) BP: 129/73 (05/22 1000) Pulse Rate: 85 (05/22 1000)  Labs: Recent Labs    08/27/23 0849  HGB 13.8  HCT 40.7  PLT 342  CREATININE 0.93    Estimated Creatinine Clearance: 66.8 mL/min (by C-G formula based on SCr of 0.93 mg/dL).   Medical History: Past Medical History:  Diagnosis Date   Diabetes mellitus without complication (HCC)    Rib injury     Medications:  Infusions:   heparin      Assessment: 3 yom presented to the ED with CP and ShOB. To start IV heparin. Baseline CBC is WNL and he is not on anticoagulation PTA.   Goal of Therapy:  Heparin level 0.3-0.7 units/ml Monitor platelets by anticoagulation protocol: Yes   Plan:  Heparin bolus 3900 units IV x 1 Heparin gtt 800 units/hr Check an 8 hr heparin level Daily heparin level and CBC  Annalis Kaczmarczyk, Kathlene Paradise 08/27/2023,10:09 AM

## 2023-08-27 NOTE — ED Notes (Signed)
 ED TO INPATIENT HANDOFF REPORT  ED Nurse Name and Phone #:   S Name/Age/Gender Anthony Forbes 73 y.o. male Room/Bed: MH05/MH05  Code Status   Code Status: Not on file  Home/SNF/Other Home Patient oriented to: self, place, time, and situation Is this baseline? Yes   Triage Complete: Triage complete  Chief Complaint Congestive heart failure (CHF) (HCC) [I50.9]  Triage Note Pt family reports that the patient had a chest xray yesterday and it showed bilateral pleural effusions. Reports chest pain and SOB. States that the NP told them to come to ED for further evaluation. States that he has been on antibiotic therapy and nothing seems to be helping. Respiratory walked pt and oxygen upon waling to room from triage area. Pt did take antibiotic and breathing treatment.    Allergies No Known Allergies  Level of Care/Admitting Diagnosis ED Disposition     ED Disposition  Admit   Condition  --   Comment  Hospital Area: [facility] [[id number]]  Level of Care: Telemetry Cardiac [103]  May admit patient to [facility] or [facility] if equivalent level of care is available:: Yes  Interfacility transfer: Yes  Covid Evaluation: Asymptomatic - no recent exposure (last 10 days) testing not required  Diagnosis: Congestive heart failure (CHF) [facility]) [[id number]]  Admitting Physician: Arne Langdon [[id number]]  Attending Physician: Arne Langdon [[id number]]  Certification:: I certify this patient will need inpatient services for at least 2 midnights  Expected Medical Readiness: 08/31/2023          B Medical/Surgery History Past Medical History:  Diagnosis Date   Diabetes mellitus without complication (HCC)    Rib injury    Past Surgical History:  Procedure Laterality Date   COSMETIC SURGERY       A IV Location/Drains/Wounds Patient Lines/Drains/Airways Status     Active Line/Drains/Airways     Name Placement date Placement time Site Days   Peripheral IV  08/27/23 20 G Anterior;Distal;Right;Upper Arm 08/27/23  0850  Arm  less than 1   Peripheral IV 08/27/23 20 G Anterior;Right Forearm 08/27/23  1000  Forearm  less than 1            Intake/Output Last 24 hours No intake or output data in the 24 hours ending 08/27/23 1458  Labs/Imaging Results for orders placed or performed during the hospital encounter of 08/27/23 (from the past 48 hours)  Basic metabolic panel     Status: Abnormal   Collection Time: 08/27/23  8:49 AM  Result Value Ref Range   Sodium 131 (L) 135 - 145 mmol/L   Potassium 4.7 3.5 - 5.1 mmol/L   Chloride 95 (L) 98 - 111 mmol/L   CO2 24 22 - 32 mmol/L   Glucose, Bld 312 (H) 70 - 99 mg/dL    Comment: Glucose reference range applies only to samples taken after fasting for at least 8 hours.   BUN 13 8 - 23 mg/dL   Creatinine, Ser 2.95 0.61 - 1.24 mg/dL   Calcium 62.1 8.9 - 30.8 mg/dL   GFR, Estimated >65 >78 mL/min    Comment: (NOTE) Calculated using the CKD-EPI Creatinine Equation (2021)    Anion gap 12 5 - 15    Comment: Performed at [facility], [address] [place], [place] [address]  CBC     Status: Abnormal   Collection Time: 08/27/23  8:49 AM  Result Value Ref Range   WBC 14.7 (H) 4.0 - 10.5 K/uL  RBC 4.50 4.22 - 5.81 MIL/uL   Hemoglobin 13.8 13.0 - 17.0 g/dL   HCT 40.9 81.1 - 91.4 %   MCV 90.4 80.0 - 100.0 fL   MCH 30.7 26.0 - 34.0 pg   MCHC 33.9 30.0 - 36.0 g/dL   RDW 78.2 95.6 - 21.3 %   Platelets 342 150 - 400 K/uL   nRBC 0.0 0.0 - 0.2 %    Comment: Performed at [facility], [address] [place], [place] [address]  Troponin T, High Sensitivity     Status: Abnormal   Collection Time: 08/27/23  8:49 AM  Result Value Ref Range   Troponin T High Sensitivity 497 (HH) <19 ng/L    Comment: Critical Value, Read Back and verified with MARVA SIMMS AT 0950 08/27/23 MARTINB (NOTE) Biotin concentrations > 1000 ng/mL falsely decrease TnT results.  Serial cardiac troponin  measurements are suggested.  Refer to the Links section for chest pain algorithms and additional  guidance. Performed at [facility], [address] [place], [place] [address]   Pro Brain natriuretic peptide     Status: Abnormal   Collection Time: 08/27/23  8:49 AM  Result Value Ref Range   Pro Brain Natriuretic Peptide 1,460.0 (H) <300.0 pg/mL    Comment: (NOTE) Age Group        Cut-Points    Interpretation  < 50 years     450 pg/mL       NT-proBNP > 450 pg/mL indicates                                ADHF is likely              50 to 75 years  900 pg/mL      NT-proBNP > 900 pg/mL indicates          ADHF is likely  > 75 years      1800 pg/mL     NT-proBNP > 1800 pg/mL indicates          ADHF is likely                           All ages    Results between       Indeterminate. Further clinical             300 and the cut-   information is needed to determine            point for age group   if ADHF is present.                                                             Elecsys proBNP II/ Elecsys proBNP II STAT           Cut-Point                       Interpretation  300 pg/mL                    NT-proBNP <300pg/mL indicates  ADHF is not likely  Performed at [facility], [address] [place], [place] [address]   Troponin T, High Sensitivity     Status: Abnormal   Collection Time: 08/27/23 11:28 AM  Result Value Ref Range   Troponin T High Sensitivity 504 (HH) <19 ng/L    Comment: Critical Value, Read Back and verified with M. SIMMS RN AT 08/27/23 1213 MB (NOTE) Biotin concentrations > 1000 ng/mL falsely decrease TnT results.  Serial cardiac troponin measurements are suggested.  Refer to the Links section for chest pain algorithms and additional  guidance. Performed at [facility], [address] [place], [place] [address]    DG Chest 2 View Result Date: 08/27/2023 CLINICAL DATA:  Chest pain. EXAM:  CHEST - 2 VIEW COMPARISON:  Chest radiograph dated 08/26/2023. FINDINGS: Small bilateral pleural effusions with bibasilar atelectasis. Pneumonia is not excluded. No pneumothorax. Stable cardiac silhouette. No acute osseous pathology. Degenerative changes of the spine. IMPRESSION: Small bilateral pleural effusions with bibasilar atelectasis. Electronically Signed   By: Angus Bark M.D.   On: 08/27/2023 09:27   DG Chest 2 View Result Date: 08/26/2023 CLINICAL DATA:  Cough.  Chest tightness. EXAM: CHEST - 2 VIEW COMPARISON:  Chest radiograph dated 04/18/2015. FINDINGS: Small bilateral pleural effusions and bibasilar atelectasis. No focal consolidation or pneumothorax. The cardiac silhouette is within normal limits. Age indeterminate, old-appearing bilateral rib fractures. Clinical correlation is recommended. IMPRESSION: Small bilateral pleural effusions and bibasilar atelectasis. Electronically Signed   By: Angus Bark M.D.   On: 08/26/2023 10:31    Pending Labs Unresulted Labs (From admission, onward)     Start     Ordered   08/27/23 1830  Heparin level (unfractionated)  Once-Timed,   URGENT        08/27/23 1008            Vitals/Pain Today's Vitals   08/27/23 1400 08/27/23 1415 08/27/23 1430 08/27/23 1445  BP: 118/70 119/82 123/72 117/71  Pulse: 74 76 73 77  Resp: (!) 28 (!) 28 (!) 32 (!) 32  Temp:      TempSrc:      SpO2: 95% 90% 96% 98%  Weight:      Height:      PainSc:        Isolation Precautions No active isolations  Medications Medications  heparin ADULT infusion 100 units/mL (25000 units/250mL) (800 Units/hr Intravenous New Bag/Given 08/27/23 1017)  aspirin chewable tablet 324 mg (324 mg Oral Given 08/27/23 0912)  furosemide (LASIX) injection 20 mg (20 mg Intravenous Given 08/27/23 1006)  heparin bolus via infusion 3,900 Units (3,900 Units Intravenous Bolus from Bag 08/27/23 1018)    Mobility walks     Focused Assessments    R Recommendations: See  Admitting Provider Note  Report given to:   Additional Notes:

## 2023-08-28 ENCOUNTER — Other Ambulatory Visit (HOSPITAL_BASED_OUTPATIENT_CLINIC_OR_DEPARTMENT_OTHER): Payer: Self-pay

## 2023-08-28 ENCOUNTER — Encounter (HOSPITAL_COMMUNITY)
Admission: EM | Disposition: A | Discharge status: Home / Self Care | Payer: Self-pay | Source: Home / Self Care | Attending: Internal Medicine

## 2023-08-28 ENCOUNTER — Other Ambulatory Visit (HOSPITAL_COMMUNITY): Payer: Self-pay

## 2023-08-28 ENCOUNTER — Telehealth (HOSPITAL_COMMUNITY): Payer: Self-pay | Admitting: Pharmacy Technician

## 2023-08-28 ENCOUNTER — Encounter (HOSPITAL_COMMUNITY): Payer: Self-pay | Admitting: Cardiology

## 2023-08-28 ENCOUNTER — Inpatient Hospital Stay (HOSPITAL_COMMUNITY)

## 2023-08-28 DIAGNOSIS — I5021 Acute systolic (congestive) heart failure: Secondary | ICD-10-CM | POA: Diagnosis not present

## 2023-08-28 DIAGNOSIS — I2510 Atherosclerotic heart disease of native coronary artery without angina pectoris: Secondary | ICD-10-CM

## 2023-08-28 DIAGNOSIS — I214 Non-ST elevation (NSTEMI) myocardial infarction: Secondary | ICD-10-CM

## 2023-08-28 HISTORY — PX: LEFT HEART CATH AND CORONARY ANGIOGRAPHY: CATH118249

## 2023-08-28 LAB — COMPREHENSIVE METABOLIC PANEL WITH GFR
ALT: 36 U/L (ref 0–44)
AST: 19 U/L (ref 15–41)
Albumin: 2.7 g/dL — ABNORMAL LOW (ref 3.5–5.0)
Alkaline Phosphatase: 91 U/L (ref 38–126)
Anion gap: 9 (ref 5–15)
BUN: 13 mg/dL (ref 8–23)
CO2: 25 mmol/L (ref 22–32)
Calcium: 9.7 mg/dL (ref 8.9–10.3)
Chloride: 102 mmol/L (ref 98–111)
Creatinine, Ser: 0.85 mg/dL (ref 0.61–1.24)
GFR, Estimated: >60 mL/min (ref >60)
Glucose, Bld: 188 mg/dL — ABNORMAL HIGH (ref 70–99)
Potassium: 4.4 mmol/L (ref 3.5–5.1)
Sodium: 136 mmol/L (ref 135–145)
Total Bilirubin: 0.8 mg/dL (ref 0.0–1.2)
Total Protein: 6 g/dL — ABNORMAL LOW (ref 6.5–8.1)

## 2023-08-28 LAB — GLUCOSE, CAPILLARY
Glucose-Capillary: 159 mg/dL — ABNORMAL HIGH (ref 70–99)
Glucose-Capillary: 196 mg/dL — ABNORMAL HIGH (ref 70–99)
Glucose-Capillary: 210 mg/dL — ABNORMAL HIGH (ref 70–99)
Glucose-Capillary: 225 mg/dL — ABNORMAL HIGH (ref 70–99)
Glucose-Capillary: 236 mg/dL — ABNORMAL HIGH (ref 70–99)
Glucose-Capillary: 290 mg/dL — ABNORMAL HIGH (ref 70–99)

## 2023-08-28 LAB — CBC
HCT: 40.2 % (ref 39.0–52.0)
Hemoglobin: 13.7 g/dL (ref 13.0–17.0)
MCH: 30.5 pg (ref 26.0–34.0)
MCHC: 34.1 g/dL (ref 30.0–36.0)
MCV: 89.5 fL (ref 80.0–100.0)
Platelets: 354 10*3/uL (ref 150–400)
RBC: 4.49 MIL/uL (ref 4.22–5.81)
RDW: 12.1 % (ref 11.5–15.5)
WBC: 11.8 10*3/uL — ABNORMAL HIGH (ref 4.0–10.5)
nRBC: 0 % (ref 0.0–0.2)

## 2023-08-28 LAB — LIPID PANEL
Cholesterol: 115 mg/dL (ref 0–200)
HDL: 37 mg/dL — ABNORMAL LOW (ref >40)
LDL Cholesterol: 65 mg/dL (ref 0–99)
Total CHOL/HDL Ratio: 3.1 ratio
Triglycerides: 67 mg/dL (ref <150)
VLDL: 13 mg/dL (ref 0–40)

## 2023-08-28 LAB — ECHOCARDIOGRAM COMPLETE
AR max vel: 2.87 cm2
AV Peak grad: 4.8 mmHg
Ao pk vel: 1.1 m/s
Area-P 1/2: 4.15 cm2
Height: 68 in
S' Lateral: 3.5 cm
Weight: 2417.6 [oz_av]

## 2023-08-28 LAB — SURGICAL PCR SCREEN
MRSA, PCR: NEGATIVE
Staphylococcus aureus: NEGATIVE

## 2023-08-28 SURGERY — LEFT HEART CATH AND CORONARY ANGIOGRAPHY
Anesthesia: LOCAL

## 2023-08-28 MED ORDER — SODIUM CHLORIDE 0.9% FLUSH: 3.0000 mL | INTRAVENOUS | Status: DC | PRN

## 2023-08-28 MED ORDER — MIDAZOLAM HCL 2 MG/2ML IJ SOLN
INTRAMUSCULAR | Status: DC | PRN
  Administered 2023-08-28: 1 mg via INTRAVENOUS

## 2023-08-28 MED ORDER — PERFLUTREN LIPID MICROSPHERE
1.0000 mL | INTRAVENOUS | Status: AC | PRN
  Administered 2023-08-28: 3 mL via INTRAVENOUS

## 2023-08-28 MED ORDER — HEPARIN (PORCINE) IN NACL 1000-0.9 UT/500ML-% IV SOLN
INTRAVENOUS | Status: DC | PRN
  Administered 2023-08-28 (×2): 500 mL

## 2023-08-28 MED ORDER — INSULIN ASPART 100 UNIT/ML IJ SOLN
0.0000 [IU] | Freq: Three times a day (TID) | INTRAMUSCULAR | Status: DC
Start: 2023-08-29 — End: 2023-08-29
  Administered 2023-08-29 (×2): 3 [IU] via SUBCUTANEOUS

## 2023-08-28 MED ORDER — VERAPAMIL HCL 2.5 MG/ML IV SOLN
INTRAVENOUS | Status: DC | PRN
  Administered 2023-08-28: 10 mL via INTRA_ARTERIAL

## 2023-08-28 MED ORDER — HEPARIN SODIUM (PORCINE) 5000 UNIT/ML IJ SOLN
5000.0000 [IU] | Freq: Three times a day (TID) | INTRAMUSCULAR | Status: DC
  Administered 2023-08-29: 5000 [IU] via SUBCUTANEOUS
  Filled 2023-08-28: qty 1

## 2023-08-28 MED ORDER — INSULIN GLARGINE 100 UNITS/ML SOLOSTAR PEN: 10.0000 [IU] | PEN_INJECTOR | Freq: Every day | SUBCUTANEOUS | Status: DC

## 2023-08-28 MED ORDER — FENTANYL CITRATE (PF) 100 MCG/2ML IJ SOLN
INTRAMUSCULAR | Status: DC | PRN
  Administered 2023-08-28: 25 ug via INTRAVENOUS

## 2023-08-28 MED ORDER — VERAPAMIL HCL 2.5 MG/ML IV SOLN
INTRAVENOUS | Status: AC
  Filled 2023-08-28: qty 2

## 2023-08-28 MED ORDER — SODIUM CHLORIDE 0.9% FLUSH
3.0000 mL | Freq: Two times a day (BID) | INTRAVENOUS | Status: DC
  Administered 2023-08-28 – 2023-08-29 (×2): 3 mL via INTRAVENOUS

## 2023-08-28 MED ORDER — MUPIROCIN 2 % EX OINT
1.0000 | TOPICAL_OINTMENT | Freq: Two times a day (BID) | CUTANEOUS | Status: DC
  Administered 2023-08-28 – 2023-08-29 (×2): 1 via NASAL
  Filled 2023-08-28 (×2): qty 22

## 2023-08-28 MED ORDER — LIVING WELL WITH DIABETES BOOK
Freq: Once | Status: AC
  Filled 2023-08-28: qty 1

## 2023-08-28 MED ORDER — FENTANYL CITRATE (PF) 100 MCG/2ML IJ SOLN
INTRAMUSCULAR | Status: AC
  Filled 2023-08-28: qty 2

## 2023-08-28 MED ORDER — ACETAMINOPHEN 325 MG PO TABS: 650.0000 mg | ORAL_TABLET | ORAL | Status: DC | PRN

## 2023-08-28 MED ORDER — ATORVASTATIN CALCIUM 80 MG PO TABS
80.0000 mg | ORAL_TABLET | Freq: Every day | ORAL | Status: DC
  Filled 2023-08-28: qty 1

## 2023-08-28 MED ORDER — SODIUM CHLORIDE 0.9 % IV SOLN: 250.0000 mL | INTRAVENOUS | Status: DC | PRN

## 2023-08-28 MED ORDER — HEPARIN SODIUM (PORCINE) 1000 UNIT/ML IJ SOLN
INTRAMUSCULAR | Status: DC | PRN
  Administered 2023-08-28: 3500 [IU] via INTRAVENOUS

## 2023-08-28 MED ORDER — LABETALOL HCL 5 MG/ML IV SOLN: 10.0000 mg | INTRAVENOUS | Status: AC | PRN

## 2023-08-28 MED ORDER — INSULIN GLARGINE-YFGN 100 UNIT/ML ~~LOC~~ SOLN
10.0000 [IU] | Freq: Every day | SUBCUTANEOUS | Status: DC
  Administered 2023-08-28: 10 [IU] via SUBCUTANEOUS
  Filled 2023-08-28 (×2): qty 0.1

## 2023-08-28 MED ORDER — LIDOCAINE HCL (PF) 1 % IJ SOLN
INTRAMUSCULAR | Status: DC | PRN
  Administered 2023-08-28: 2 mL

## 2023-08-28 MED ORDER — SODIUM CHLORIDE 0.9 % IV SOLN
INTRAVENOUS | Status: AC
Start: 2023-08-28 — End: 2023-08-28

## 2023-08-28 MED ORDER — PERFLUTREN LIPID MICROSPHERE: 1.0000 mL | INTRAVENOUS | Status: AC | PRN

## 2023-08-28 MED ORDER — IOHEXOL 350 MG/ML SOLN
INTRAVENOUS | Status: DC | PRN
  Administered 2023-08-28: 40 mL via INTRA_ARTERIAL

## 2023-08-28 MED ORDER — HEPARIN SODIUM (PORCINE) 1000 UNIT/ML IJ SOLN
INTRAMUSCULAR | Status: AC
  Filled 2023-08-28: qty 10

## 2023-08-28 MED ORDER — MIDAZOLAM HCL 2 MG/2ML IJ SOLN
INTRAMUSCULAR | Status: AC
  Filled 2023-08-28: qty 2

## 2023-08-28 MED ORDER — INSULIN ASPART 100 UNIT/ML IJ SOLN
0.0000 [IU] | Freq: Every day | INTRAMUSCULAR | Status: DC
  Administered 2023-08-28: 2 [IU] via SUBCUTANEOUS

## 2023-08-28 MED ORDER — ONDANSETRON HCL 4 MG/2ML IJ SOLN: 4.0000 mg | Freq: Four times a day (QID) | INTRAMUSCULAR | Status: DC | PRN

## 2023-08-28 MED ORDER — HEPARIN SODIUM (PORCINE) 5000 UNIT/ML IJ SOLN: 5000.0000 [IU] | Freq: Three times a day (TID) | INTRAMUSCULAR | Status: DC

## 2023-08-28 MED ORDER — LIDOCAINE HCL (PF) 1 % IJ SOLN
INTRAMUSCULAR | Status: AC
  Filled 2023-08-28: qty 30

## 2023-08-28 SURGICAL SUPPLY — 10 items
CATH 5FR JL3.5 JR4 ANG PIG MP (CATHETERS) IMPLANT
CATH INFINITI AMBI 5FR TG (CATHETERS) IMPLANT
CATH LAUNCHER 6FR EBU 3 (CATHETERS) IMPLANT
DEVICE RAD COMP TR BAND LRG (VASCULAR PRODUCTS) IMPLANT
GLIDESHEATH SLEND A-KIT 6F 22G (SHEATH) IMPLANT
GUIDEWIRE INQWIRE 1.5J.035X260 (WIRE) IMPLANT
KIT SYRINGE INJ CVI SPIKEX1 (MISCELLANEOUS) IMPLANT
PACK CARDIAC CATHETERIZATION (CUSTOM PROCEDURE TRAY) ×2 IMPLANT
SET ATX-X65L (MISCELLANEOUS) IMPLANT
SHEATH PROBE COVER 6X72 (BAG) IMPLANT

## 2023-08-28 NOTE — H&P (View-Only) (Signed)
 Rounding Note    Patient Name: Anthony Forbes Date of Encounter: 08/28/2023  [facility] Cardiologist: None   Subjective   Denies any CP or SOB.   Inpatient Medications    Scheduled Meds:  [START ON 08/29/2023] aspirin EC  81 mg Oral Daily   atorvastatin  80 mg Oral Daily   insulin aspart  0-9 Units Subcutaneous Q4H   mupirocin ointment  1 Application Nasal BID   Continuous Infusions:  sodium chloride  10 mL/hr at 08/28/23 0543   heparin 1,050 Units/hr (08/28/23 0747)   PRN Meds: acetaminophen   Vital Signs    Vitals:   08/27/23 1954 08/28/23 0030 08/28/23 0418 08/28/23 0740  BP: (!) 141/76 134/66 127/69   Pulse: 85 82 79   Resp: 19 17 18    Temp: 99.5 F (37.5 C) 99.3 F (37.4 C) 99.1 F (37.3 C)   TempSrc: Oral Oral Oral Oral  SpO2: 94% 92% 93%   Weight:   68.5 kg   Height:        Intake/Output Summary (Last 24 hours) at 08/28/2023 0901 Last data filed at 08/28/2023 9562 Gross per 24 hour  Intake 223.85 ml  Output 300 ml  Net -76.15 ml      08/28/2023    4:18 AM 08/27/2023    5:15 PM 08/27/2023    8:39 AM  Last 3 Weights  Weight (lbs) 151 lb 1.6 oz 152 lb 1.9 oz 142 lb 12.8 oz  Weight (kg) 68.539 kg 69 kg 64.774 kg      Telemetry    NSR without significant ventricular ectopy - Personally Reviewed  ECG    NSR with minimal inferior leads ST elevation and Q wave and TWI in the lateral leads - Personally Reviewed  Physical Exam   GEN: No acute distress.   Neck: No JVD Cardiac: RRR, no murmurs, rubs, or gallops.  Respiratory: Clear to auscultation bilaterally. GI: Soft, nontender, non-distended  MS: No edema; No deformity. Neuro:  Nonfocal  Psych: Normal affect   Labs    High Sensitivity Troponin:  No results for input(s): "TROPONINIHS" in the last 720 hours.   Chemistry Recent Labs  Lab 08/27/23 0849  NA 131*  K 4.7  CL 95*  CO2 24  GLUCOSE 312*  BUN 13  CREATININE 0.93  CALCIUM 10.0  GFRNONAA >60  ANIONGAP 12     Lipids No results for input(s): "CHOL", "TRIG", "HDL", "LABVLDL", "LDLCALC", "CHOLHDL" in the last 168 hours.  Hematology Recent Labs  Lab 08/27/23 0849  WBC 14.7*  RBC 4.50  HGB 13.8  HCT 40.7  MCV 90.4  MCH 30.7  MCHC 33.9  RDW 12.1  PLT 342   Thyroid No results for input(s): "TSH", "FREET4" in the last 168 hours.  BNP Recent Labs  Lab 08/27/23 0849  PROBNP 1,460.0*    DDimer No results for input(s): "DDIMER" in the last 168 hours.   Radiology    DG Chest 2 View Result Date: 08/27/2023 CLINICAL DATA:  Chest pain. EXAM: CHEST - 2 VIEW COMPARISON:  Chest radiograph dated 08/26/2023. FINDINGS: Small bilateral pleural effusions with bibasilar atelectasis. Pneumonia is not excluded. No pneumothorax. Stable cardiac silhouette. No acute osseous pathology. Degenerative changes of the spine. IMPRESSION: Small bilateral pleural effusions with bibasilar atelectasis. Electronically Signed   By: Angus Bark M.D.   On: 08/27/2023 09:27   DG Chest 2 View Result Date: 08/26/2023 CLINICAL DATA:  Cough.  Chest tightness. EXAM: CHEST - 2 VIEW COMPARISON:  Chest radiograph dated 04/18/2015. FINDINGS: Small bilateral pleural effusions and bibasilar atelectasis. No focal consolidation or pneumothorax. The cardiac silhouette is within normal limits. Age indeterminate, old-appearing bilateral rib fractures. Clinical correlation is recommended. IMPRESSION: Small bilateral pleural effusions and bibasilar atelectasis. Electronically Signed   By: Angus Bark M.D.   On: 08/26/2023 10:31    Cardiac Studies   N/A  Patient Profile     73 y.o. male with PMH of DM II presented with chest pain, fatigue and has pleural effusions on CXR, trop elevated, admitted for both NSTEMI and CHF, has L&RHC planned.   Assessment & Plan    NSTEMI  - serial trop 497 --> 504. EKG showed sinus rhythm with inferior Q wave and mild ST elevation, and TWI in the anterior leads.   - suspect patient had a missed  STEMI event 2 weeks ago  - pending echocardiogram. Plans for left and right heart cath.   - Risk and benefit of procedure explained to the patient who display clear understanding and agree to proceed. Discussed with patient possible procedural risk include bleeding, vascular injury, renal injury, arrythmia, MI, stroke and loss of limb or life.  Acute CHF  - proBNP 1460.   - received IV lasix 20mg   DM II: hgb A1C 10.6, uncontrolled      For questions or updates, please contact [facility] Please consult [email or web address] for contact info under        Signed, Ervin Heath, PA  08/28/2023, 9:01 AM    Patient seen, examined. Available data reviewed. Agree with findings, assessment, and plan as outlined by Ervin Heath, PA.  The patient is independently interviewed and examined.  He is alert, oriented, in no distress.  HEENT is normal, lungs are clear bilaterally, heart is regular rate and rhythm with no murmur gallop, abdomen is soft and nontender, radial pulses are 2+, there is no pretibial edema.  EKG shows sinus rhythm with age-indeterminate inferior infarct and ST and T wave abnormality consider lateral ischemia.  He had a prolonged episode of chest pain several weeks ago.  He then has been experiencing shortness of breath and was found to have bilateral pleural effusions on x-ray, referred to the emergency department where his proBNP and high-sensitivity troponins were elevated.  He currently is asymptomatic on IV heparin.  I suspect he had an out-of-hospital MI several weeks ago associated with postinfarct heart failure.  He appears clinically stable and euvolemic now.  Recommend the following:  Echo now completed and demonstrates akinesis of the inferior and inferolateral walls with moderate segmental LV dysfunction, LVEF 40 to 45%.  RV function is normal.  There is a small pericardial effusion.  There is no significant valvular disease. Cardiac catheterization with coronary angiography +/-  PCI. I have reviewed the risks, indications, and alternatives to cardiac catheterization, possible angioplasty, and stenting with the patient. Risks include but are not limited to bleeding, infection, vascular injury, stroke, myocardial infection, arrhythmia, kidney injury, radiation-related injury in the case of prolonged fluoroscopy use, emergency cardiac surgery, and death. The patient understands the risks of serious complication is 1-2 in 1000 with diagnostic cardiac cath and 1-2% or less with angioplasty/stenting.   Continue IV heparin with plans for DAPT once he undergoes cardiac catheterization.  He understands that if he has an occluded vessel supplying the inferolateral wall with collaterals, medical therapy will likely be recommended as his infarct was likely several weeks ago. Continue atorvastatin 80 mg daily Add beta-blocker and ACE/ARB as blood  pressure tolerates postcardiac catheterization  Arnoldo Lapping, M.D. 08/28/2023 12:46 PM

## 2023-08-28 NOTE — Progress Notes (Signed)
 Patient back from the cath lab. TR band to right radial. No bleeding or hematoma noted. No intervention done. MP shows NSR. Patient denies complaints.

## 2023-08-28 NOTE — Progress Notes (Signed)
 Echocardiogram 2D Echocardiogram has been performed.  Anthony Forbes 08/28/2023, 10:38 AM

## 2023-08-28 NOTE — Progress Notes (Addendum)
   Heart Failure Stewardship Pharmacist Progress Note   PCP: Norrine Bedford, CRNP PCP-Cardiologist: None    HPI:  73 yo M with PMH of T2DM.   Presented to the ED on 5/22 with shortness of breath and chest pain. Has not been feeling well over the last few weeks. Two weeks ago was started on azithromycin with some improvement but still felt tired and weak. Followed back with PCP and was started on doxycycline . CXR on 5/21 and 5/22 with small bilateral pleural effusions. Trop 161>096. proBNP 1460. Started on IV lasix. EKG showing sinus rhythm with inferior Q waves and about 1 mm ST elevation as well as T wave inversions in leads V4-V6 suggesting recent inferior infarct.  Cardiologist reviewed the EKG and felt that it did not meet STEMI criteria. Cardiology was consulted for NSTEMI and new CHF. ECHO pending. R/LHC scheduled for today.  Declines shortness of breath, no LE edema. Agreeable to using [facility] TOC pharmacy at discharge. Does not want to schedule follow up with HF TOC clinic. Daughter states she will arrange follow up with Dr. Aden Agreste with [facility].   Current HF Medications: None  Prior to admission HF Medications: None  Pertinent Lab Values: Serum creatinine 0.85, BUN 13, Potassium 4.4, Sodium 136, proBNP 1460, A1c 10.6   Vital Signs: Weight: 151 lbs (admission weight: 152 lbs) Blood pressure: 110-130/60s  Heart rate: 70-80s  I/O: net -0.2L yesterday; net -70mL since admission  Medication Assistance / Insurance Benefits Check: Does the patient have prescription insurance?  Yes Type of insurance plan: BCBS Medicare  Outpatient Pharmacy:  Prior to admission outpatient pharmacy: [facility] Is the patient willing to use [facility] TOC pharmacy at discharge? Yes Is the patient willing to transition their outpatient pharmacy to utilize a [facility] outpatient pharmacy?   No    Assessment: 1. Acute CHF (LVEF pending), pending R/LHC today. NYHA class II symptoms. -  Does not appear volume overloaded on exam. Cath today to confirm volume status. - Consider adding BB once cardiac output confirmed on cath - Consider adding Entresto 24/26 mg BID - Consider MRA and SGLT2i prior to discharge. A1c 10.6%.   Plan: 1) Medication changes recommended at this time: - Start Entresto 24/26 mg BID - Further recommendations pending cath results  2) Patient assistance: - Farixga/Jardiance copay $45 - Entresto copay $45  3)  Education  - Initial education completed  - Full education to be completed prior to discharge   Addendum: EF 40-45%. 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 No revascularization at this point. Recommended medical management with DAPT for 1 year. Met with patient and his daughter after returning from cath. She has been in contact with Dr. Aden Agreste and states that since a stent was not placed, did not recommend DAPT, only aspirin at discharge. Explained the benefits behind DAPT but daughter expresses resistance to this since she is hearing something else from Dr. Aden Agreste. Notified Dr. Arlester Ladd. She is also resistant to having him start GDMT for his HF, and is requesting that if something is started, that it would be short term, if possible. Expresses again to not have additional follow up with [facility]/HF clinic. States that if patient changes his mind, he can call to get something scheduled later.   Jerilyn Monte, PharmD, BCPS Heart Failure Stewardship Pharmacist Phone [phone]

## 2023-08-28 NOTE — Interval H&P Note (Signed)
 History and Physical Interval Note:  08/28/2023 2:58 PM  Anthony Forbes  has presented today for surgery, with the diagnosis of HF.  The various methods of treatment have been discussed with the patient and family. After consideration of risks, benefits and other options for treatment, the patient has consented to  Procedure(s): LEFT HEART CATH AND CORONARY ANGIOGRAPHY (N/A) as a surgical intervention.  The patient's history has been reviewed, patient examined, no change in status, stable for surgery.  I have reviewed the patient's chart and labs.  Questions were answered to the patient's satisfaction.     Bradley Handyside J Darrelle Barrell

## 2023-08-28 NOTE — Inpatient Diabetes Management (Addendum)
 Inpatient Diabetes Program Recommendations  AACE/ADA: New Consensus Statement on Inpatient Glycemic Control (2015)  Target Ranges:  Prepandial:   less than 140 mg/dL      Peak postprandial:   less than 180 mg/dL (1-2 hours)      Critically ill patients:  140 - 180 mg/dL   Lab Results  Component Value Date   GLUCAP 236 (H) 08/28/2023   HGBA1C 10.6 (H) 08/27/2023    Latest Reference Range & Units 08/27/23 21:33 08/27/23 23:59 08/28/23 04:03 08/28/23 10:54  Glucose-Capillary 70 - 99 mg/dL 782 (H)  Novolog 9 units 290 (H)  Novolog 5 units 236 (H)  Novolog 3 units  159 (H)  (H): Data is abnormally high   Diabetes history: DM2 Outpatient Diabetes medications: None Current orders for Inpatient glycemic control: Novolog 0-9 units Q4H  Met with patient and spouse at bedside.  He is waiting for a cardiac cath today.  Currently NPO.  He has had diabetes for 10 yrs and has managed with diet and exercise.  He will be seeing Koleen Perna for his PCP which is who his wife see's.  He checks his blood sugar at home after he eats.  Asked him to start checking a fasting BG and 3-4 hrs after lunch or dinner.  They eat a healthy diet and he does not drink anything with sugar.  He exercises regularly.  Reviewed patient's current A1c of 10.6%%. Explained what a A1c is and what it measures. Also reviewed goal A1c with patient, importance of good glucose control @ home, and blood sugar goals.  Discussed basic patho of DM.  Asked MD for a C-Peptide due to patient being physically fit and not overweight.  He has had unintentional weight loss over the last 2 months.    Educated patient on insulin pen use at home incase he discharges over the weekend on insulin.   Reviewed contents of insulin flexpen starter kit. Reviewed all steps of insulin pen including attachment of needle, 2-unit air shot, dialing up dose, giving injection, removing needle, disposal of sharps, storage of unused insulin, disposal of insulin  etc. Patient able to provide successful return demonstration. Also reviewed troubleshooting with insulin pen. MD to give patient Rxs for insulin pens and insulin pen needles.  Discussed hypoglycemia, signs, symptoms and treatments.    Ordered the LWWDM booklet and insulin start kit.  Educated on The Plate Method, CHO's, portion control, avoiding caloric beverages, CBGs at home fasting and mid afternoon, F/U with PCP every 3 months, bring meter to PCP office, long and short term complications of uncontrolled BG, and importance of exercise.  Might be interested in a continuous glucose monitor if discharged on insulin.  Transport is at [facility] for [employer].    Thank you, Hays Lipschutz, MSN, CDCES Diabetes Coordinator Inpatient Diabetes Program [phone] (team pager from 8a-5p)

## 2023-08-28 NOTE — Telephone Encounter (Addendum)
 Patient Product/process development scientist completed.    The patient is insured through [facility]. Patient has Medicare and is not eligible for a copay card, but may be able to apply for patient assistance or Medicare RX Payment Plan (Patient Must reach out to their plan, if eligible for payment plan), if available.    Ran test claim for Lantus Pen and the current 30 day co-pay is $35.00.  Ran test claim for Novolog FlexPen and the current 30 day co-pay is $35.00.  Ran test claim for Entresto 24-26 mg and the current 30 day co-pay is $45.00.  This test claim was processed through [facility]- copay amounts may vary at other pharmacies due to pharmacy/plan contracts, or as the patient moves through the different stages of their insurance plan.     Morgan Arab, CPHT Pharmacy Technician III Certified Patient Advocate [facility] Patient Advocate Team Direct Number: [phone]  Fax: [phone]

## 2023-08-28 NOTE — Progress Notes (Signed)
 Mobility Specialist Progress Note:    08/28/23 1100  Mobility  Activity Ambulated with assistance in hallway  Level of Assistance Standby assist, set-up cues, supervision of patient - no hands on  Assistive Device Other (Comment) (IV Pole)  Distance Ambulated (ft) 500 ft  Activity Response Tolerated well  Mobility Referral Yes  Mobility visit 1 Mobility  Mobility Specialist Start Time (ACUTE ONLY) 0930  Mobility Specialist Stop Time (ACUTE ONLY) 0940  Mobility Specialist Time Calculation (min) (ACUTE ONLY) 10 min   Received pt in bed having no complaints and agreeable to mobility. Pt was asymptomatic throughout ambulation and returned to room w/o fault. Left in bed w/ call bell in reach and all needs met.   Inetta Manes Mobility Specialist  Please contact vis Secure Chat or  Rehab Office [phone]

## 2023-08-28 NOTE — Progress Notes (Signed)
 PROGRESS NOTE    Anthony Forbes  NWG:[id number] DOB: 1950-04-23 DOA: 08/27/2023 PCP: Norrine Bedford, CRNP  Outpatient Specialists:     Brief Narrative:  Patient is a 73 year old male with past medical history significant for type 2 diabetes mellitus.  Patient presented with 2-week history of chest pain and fatigue.  Chest x-ray done on outpatient basis revealed pleural effusion.  On presentation to the hospital, WBC was 14.7, sodium of 131, glucose of 312, proBNP of 1460, troponin of 497/504.  Patient was initially initiated on IV Lasix, aspirin and heparin.  Cardiology team has been directing care.  08/28/2023: Patient seen alongside patient's wife.  No shortness of breath.  No chest pain.  Patient is awaiting cardiac catheterization.   Assessment & Plan:   Principal Problem:   Non-ST elevation (NSTEMI) myocardial infarction [facility]) Active Problems:   Congestive heart failure (CHF) (HCC)   DM2 (diabetes mellitus, type 2) (HCC)   Leukocytosis   NSTEMI -Patient presenting with 2-week history of exertional chest heaviness.   -Currently asymptomatic.   -Troponin 497> 504. EKG showing sinus rhythm with inferior Q waves and about 1 mm ST elevation as well as T wave inversions in leads V4-V6 suggesting recent inferior infarct.  Cardiologist reviewed the EKG and felt that it did not meet STEMI criteria. - Cardiology team is directing care. - Awaiting cardiac catheterization.     Acute CHF -proBNP 1460 and chest x-ray showing small bilateral pleural effusions.   -Patient was given IV Lasix 20 mg in the ED.   -Cardiology recommending holding off on additional diuretics  -Echocardiogram revealed akinesis of the inferior and inferior lateral wall with overall mild to moderate LV dysfunction (estimated left ventricle ejection fraction of 40 to 40%), and grade 1 diastolic dysfunction. - For cardiac catheterization later today.  Poorly controlled type 2 diabetes -Diabetes mellitus remains  uncontrolled. - A1c of 10.6%.   - Continue sliding scale insulin coverage.     Mild leukocytosis -Patient recently treated with outpatient antibiotic for pneumonia.   -No infectious signs or symptoms at this time.   - WBC has improved from 14.7-11.8.    DVT prophylaxis: Heparin. Code Status: Full code. Family Communication: Wife. Disposition Plan: Inpatient.   Consultants:  Cardiology.  Procedures:  Cardiac catheterization is planned later today  Antimicrobials:  None   Subjective: No chest pain. No shortness of breath.  Objective: Vitals:   08/27/23 1845 08/27/23 1954 08/28/23 0030 08/28/23 0418  BP: 132/81 (!) 141/76 134/66 127/69  Pulse: 86 85 82 79  Resp: 18 19 17 18   Temp: 98.4 F (36.9 C) 99.5 F (37.5 C) 99.3 F (37.4 C) 99.1 F (37.3 C)  TempSrc: Oral Oral Oral Oral  SpO2: 95% 94% 92% 93%  Weight:    68.5 kg  Height:        Intake/Output Summary (Last 24 hours) at 08/28/2023 0742 Last data filed at 08/28/2023 5784 Gross per 24 hour  Intake 223.85 ml  Output 300 ml  Net -76.15 ml   Filed Weights   08/27/23 0839 08/27/23 1715 08/28/23 0418  Weight: 64.8 kg 69 kg 68.5 kg    Examination:  General exam: Appears calm and comfortable  Respiratory system: Clear to auscultation.  Cardiovascular system: S1 & S2 heard Gastrointestinal system: Abdomen is soft.   Central nervous system: Alert and oriented.  Moves extremities. Extremities: No leg edema.  Data Reviewed: I have personally reviewed following labs and imaging studies  CBC: Recent Labs  Lab 08/27/23  0849  WBC 14.7*  HGB 13.8  HCT 40.7  MCV 90.4  PLT 342   Basic Metabolic Panel: Recent Labs  Lab 08/27/23 0849  NA 131*  K 4.7  CL 95*  CO2 24  GLUCOSE 312*  BUN 13  CREATININE 0.93  CALCIUM 10.0   GFR: Estimated Creatinine Clearance: 70.5 mL/min (by C-G formula based on SCr of 0.93 mg/dL). Liver Function Tests: No results for input(s): "AST", "ALT", "ALKPHOS", "BILITOT",  "PROT", "ALBUMIN" in the last 168 hours. No results for input(s): "LIPASE", "AMYLASE" in the last 168 hours. No results for input(s): "AMMONIA" in the last 168 hours. Coagulation Profile: No results for input(s): "INR", "PROTIME" in the last 168 hours. Cardiac Enzymes: No results for input(s): "CKTOTAL", "CKMB", "CKMBINDEX", "TROPONINI" in the last 168 hours. BNP (last 3 results) Recent Labs    08/27/23 0849  PROBNP 1,460.0*   HbA1C: Recent Labs    08/27/23 1938  HGBA1C 10.6*   CBG: Recent Labs  Lab 08/27/23 2133 08/27/23 2359 08/28/23 0403  GLUCAP 385* 290* 236*   Lipid Profile: No results for input(s): "CHOL", "HDL", "LDLCALC", "TRIG", "CHOLHDL", "LDLDIRECT" in the last 72 hours. Thyroid Function Tests: No results for input(s): "TSH", "T4TOTAL", "FREET4", "T3FREE", "THYROIDAB" in the last 72 hours. Anemia Panel: No results for input(s): "VITAMINB12", "FOLATE", "FERRITIN", "TIBC", "IRON", "RETICCTPCT" in the last 72 hours. Urine analysis: No results found for: "COLORURINE", "APPEARANCEUR", "LABSPEC", "PHURINE", "GLUCOSEU", "HGBUR", "BILIRUBINUR", "KETONESUR", "PROTEINUR", "UROBILINOGEN", "NITRITE", "LEUKOCYTESUR" Sepsis Labs: @LABRCNTIP (procalcitonin:4,lacticidven:4)  ) Recent Results (from the past 240 hours)  Surgical PCR screen     Status: None   Collection Time: 08/28/23  4:00 AM   Specimen: Nasal Mucosa; Nasal Swab  Result Value Ref Range Status   MRSA, PCR NEGATIVE NEGATIVE Final   Staphylococcus aureus NEGATIVE NEGATIVE Final    Comment: (NOTE) The Xpert SA Assay (FDA approved for NASAL specimens in patients 4 years of age and older), is one component of a comprehensive surveillance program. It is not intended to diagnose infection nor to guide or monitor treatment. Performed at [facility], [address], [place], [place] [address]          Radiology Studies: DG Chest 2 View Result Date: 08/27/2023 CLINICAL DATA:  Chest pain. EXAM: CHEST  - 2 VIEW COMPARISON:  Chest radiograph dated 08/26/2023. FINDINGS: Small bilateral pleural effusions with bibasilar atelectasis. Pneumonia is not excluded. No pneumothorax. Stable cardiac silhouette. No acute osseous pathology. Degenerative changes of the spine. IMPRESSION: Small bilateral pleural effusions with bibasilar atelectasis. Electronically Signed   By: Angus Bark M.D.   On: 08/27/2023 09:27   DG Chest 2 View Result Date: 08/26/2023 CLINICAL DATA:  Cough.  Chest tightness. EXAM: CHEST - 2 VIEW COMPARISON:  Chest radiograph dated 04/18/2015. FINDINGS: Small bilateral pleural effusions and bibasilar atelectasis. No focal consolidation or pneumothorax. The cardiac silhouette is within normal limits. Age indeterminate, old-appearing bilateral rib fractures. Clinical correlation is recommended. IMPRESSION: Small bilateral pleural effusions and bibasilar atelectasis. Electronically Signed   By: Angus Bark M.D.   On: 08/26/2023 10:31        Scheduled Meds:  [START ON 08/29/2023] aspirin EC  81 mg Oral Daily   atorvastatin  80 mg Oral Daily   insulin aspart  0-9 Units Subcutaneous Q4H   mupirocin ointment  1 Application Nasal BID   Continuous Infusions:  sodium chloride  10 mL/hr at 08/28/23 0543   heparin Stopped (08/28/23 0619)     LOS: 1 day  Time spent: 35 minutes.    Fonnie Iba, MD  [facility] Pager #: [phone] 7PM-7AM contact night coverage as above

## 2023-08-28 NOTE — TOC Benefit Eligibility Note (Signed)
 Pharmacy Patient Advocate Encounter  Insurance verification completed.    The patient is insured through [facility]. Patient has Medicare and is not eligible for a copay card, but may be able to apply for patient assistance or Medicare RX Payment Plan (Patient Must reach out to their plan, if eligible for payment plan), if available.    Ran test claim for Farxiga 10mg  and the current 30 day co-pay is $45.  Ran test claim for Jaridance 10mg  and the current 30 day co-pay is $45.  This test claim was processed through [employer]- copay amounts may vary at other pharmacies due to [employer], or as the patient moves through the different stages of their insurance plan.

## 2023-08-28 NOTE — Progress Notes (Signed)
 Patient to cath lab. Family at bedside .

## 2023-08-28 NOTE — Progress Notes (Signed)
 Uncontrolled DM type II-not on insulin at home  A1c above 10. Patient was on sliding scale insulin coverage every 4 hours with POC blood glucose check.  Given diet has been restarted starting long-acting insulin 10 units from tonight and continue sliding scale insulin with mealtime coverage as well as nighttime.  Consulted inpatient diabetic educator to management of insulin and educate patient about diet control.  Given patient has a heart cath today there is high risk for development of a acute kidney injury and in the situation avoiding starting metformin. Of note note at home patient is not any oral antidiabetic agent neither on insulin.

## 2023-08-28 NOTE — Progress Notes (Addendum)
 Rounding Note    Patient Name: AMARRI SATTERLY Date of Encounter: 08/28/2023  [facility] Cardiologist: None   Subjective   Denies any CP or SOB.   Inpatient Medications    Scheduled Meds:  [START ON 08/29/2023] aspirin EC  81 mg Oral Daily   atorvastatin  80 mg Oral Daily   insulin aspart  0-9 Units Subcutaneous Q4H   mupirocin ointment  1 Application Nasal BID   Continuous Infusions:  sodium chloride  10 mL/hr at 08/28/23 0543   heparin 1,050 Units/hr (08/28/23 0747)   PRN Meds: acetaminophen   Vital Signs    Vitals:   08/27/23 1954 08/28/23 0030 08/28/23 0418 08/28/23 0740  BP: (!) 141/76 134/66 127/69   Pulse: 85 82 79   Resp: 19 17 18    Temp: 99.5 F (37.5 C) 99.3 F (37.4 C) 99.1 F (37.3 C)   TempSrc: Oral Oral Oral Oral  SpO2: 94% 92% 93%   Weight:   68.5 kg   Height:        Intake/Output Summary (Last 24 hours) at 08/28/2023 0901 Last data filed at 08/28/2023 9562 Gross per 24 hour  Intake 223.85 ml  Output 300 ml  Net -76.15 ml      08/28/2023    4:18 AM 08/27/2023    5:15 PM 08/27/2023    8:39 AM  Last 3 Weights  Weight (lbs) 151 lb 1.6 oz 152 lb 1.9 oz 142 lb 12.8 oz  Weight (kg) 68.539 kg 69 kg 64.774 kg      Telemetry    NSR without significant ventricular ectopy - Personally Reviewed  ECG    NSR with minimal inferior leads ST elevation and Q wave and TWI in the lateral leads - Personally Reviewed  Physical Exam   GEN: No acute distress.   Neck: No JVD Cardiac: RRR, no murmurs, rubs, or gallops.  Respiratory: Clear to auscultation bilaterally. GI: Soft, nontender, non-distended  MS: No edema; No deformity. Neuro:  Nonfocal  Psych: Normal affect   Labs    High Sensitivity Troponin:  No results for input(s): "TROPONINIHS" in the last 720 hours.   Chemistry Recent Labs  Lab 08/27/23 0849  NA 131*  K 4.7  CL 95*  CO2 24  GLUCOSE 312*  BUN 13  CREATININE 0.93  CALCIUM 10.0  GFRNONAA >60  ANIONGAP 12     Lipids No results for input(s): "CHOL", "TRIG", "HDL", "LABVLDL", "LDLCALC", "CHOLHDL" in the last 168 hours.  Hematology Recent Labs  Lab 08/27/23 0849  WBC 14.7*  RBC 4.50  HGB 13.8  HCT 40.7  MCV 90.4  MCH 30.7  MCHC 33.9  RDW 12.1  PLT 342   Thyroid No results for input(s): "TSH", "FREET4" in the last 168 hours.  BNP Recent Labs  Lab 08/27/23 0849  PROBNP 1,460.0*    DDimer No results for input(s): "DDIMER" in the last 168 hours.   Radiology    DG Chest 2 View Result Date: 08/27/2023 CLINICAL DATA:  Chest pain. EXAM: CHEST - 2 VIEW COMPARISON:  Chest radiograph dated 08/26/2023. FINDINGS: Small bilateral pleural effusions with bibasilar atelectasis. Pneumonia is not excluded. No pneumothorax. Stable cardiac silhouette. No acute osseous pathology. Degenerative changes of the spine. IMPRESSION: Small bilateral pleural effusions with bibasilar atelectasis. Electronically Signed   By: Angus Bark M.D.   On: 08/27/2023 09:27   DG Chest 2 View Result Date: 08/26/2023 CLINICAL DATA:  Cough.  Chest tightness. EXAM: CHEST - 2 VIEW COMPARISON:  Chest radiograph dated 04/18/2015. FINDINGS: Small bilateral pleural effusions and bibasilar atelectasis. No focal consolidation or pneumothorax. The cardiac silhouette is within normal limits. Age indeterminate, old-appearing bilateral rib fractures. Clinical correlation is recommended. IMPRESSION: Small bilateral pleural effusions and bibasilar atelectasis. Electronically Signed   By: Angus Bark M.D.   On: 08/26/2023 10:31    Cardiac Studies   N/A  Patient Profile     73 y.o. male with PMH of DM II presented with chest pain, fatigue and has pleural effusions on CXR, trop elevated, admitted for both NSTEMI and CHF, has L&RHC planned.   Assessment & Plan    NSTEMI  - serial trop 497 --> 504. EKG showed sinus rhythm with inferior Q wave and mild ST elevation, and TWI in the anterior leads.   - suspect patient had a missed  STEMI event 2 weeks ago  - pending echocardiogram. Plans for left and right heart cath.   - Risk and benefit of procedure explained to the patient who display clear understanding and agree to proceed. Discussed with patient possible procedural risk include bleeding, vascular injury, renal injury, arrythmia, MI, stroke and loss of limb or life.  Acute CHF  - proBNP 1460.   - received IV lasix 20mg   DM II: hgb A1C 10.6, uncontrolled      For questions or updates, please contact [facility] Please consult [email or web address] for contact info under        Signed, Ervin Heath, PA  08/28/2023, 9:01 AM    Patient seen, examined. Available data reviewed. Agree with findings, assessment, and plan as outlined by Ervin Heath, PA.  The patient is independently interviewed and examined.  He is alert, oriented, in no distress.  HEENT is normal, lungs are clear bilaterally, heart is regular rate and rhythm with no murmur gallop, abdomen is soft and nontender, radial pulses are 2+, there is no pretibial edema.  EKG shows sinus rhythm with age-indeterminate inferior infarct and ST and T wave abnormality consider lateral ischemia.  He had a prolonged episode of chest pain several weeks ago.  He then has been experiencing shortness of breath and was found to have bilateral pleural effusions on x-ray, referred to the emergency department where his proBNP and high-sensitivity troponins were elevated.  He currently is asymptomatic on IV heparin.  I suspect he had an out-of-hospital MI several weeks ago associated with postinfarct heart failure.  He appears clinically stable and euvolemic now.  Recommend the following:  Echo now completed and demonstrates akinesis of the inferior and inferolateral walls with moderate segmental LV dysfunction, LVEF 40 to 45%.  RV function is normal.  There is a small pericardial effusion.  There is no significant valvular disease. Cardiac catheterization with coronary angiography +/-  PCI. I have reviewed the risks, indications, and alternatives to cardiac catheterization, possible angioplasty, and stenting with the patient. Risks include but are not limited to bleeding, infection, vascular injury, stroke, myocardial infection, arrhythmia, kidney injury, radiation-related injury in the case of prolonged fluoroscopy use, emergency cardiac surgery, and death. The patient understands the risks of serious complication is 1-2 in 1000 with diagnostic cardiac cath and 1-2% or less with angioplasty/stenting.   Continue IV heparin with plans for DAPT once he undergoes cardiac catheterization.  He understands that if he has an occluded vessel supplying the inferolateral wall with collaterals, medical therapy will likely be recommended as his infarct was likely several weeks ago. Continue atorvastatin 80 mg daily Add beta-blocker and ACE/ARB as blood  pressure tolerates postcardiac catheterization  Arnoldo Lapping, M.D. 08/28/2023 12:46 PM

## 2023-08-28 NOTE — Progress Notes (Signed)
 Heart Failure Navigator Progress Note  Assessed for Heart & Vascular TOC clinic readiness.   Patient and his daughter are declining follow up with HF TOC at this time.   Navigator available for reassessment of patient.   Jerilyn Monte, PharmD, BCPS Heart Failure Stewardship Pharmacist Phone [phone]

## 2023-08-28 NOTE — Progress Notes (Signed)
 Went to bedside to speak with patient, wife, and daughter about post-MI medical therapy recommendations. I reviewed his cardiac cath and echo findings, as well as indications for medical therapy in setting of diabetes, moderate LV dysfunction, and medically treated MI. I explained the rationale for ASA, clopidogrel, high-intensity statin drug, and beta-blocker/ACE-I or ARB. I specifically emphasized benefit in reduced risk of recurrent MI and heart failure. Despite that, the patient states that he will not take anything except aspirin and insulin. He is not interested in any other prescription medications and lists off many side effects that he will not tolerate. He plans to follow-up with his personal physician and does not wish for any cardiology follow-up in our practice. I recommended that he have a follow-up CXR in a few weeks to make sure that his effusions have resolved. He otherwise appears stable. I will discontinue all of his medications except for aspirin and insulin at this time.   Anthony Forbes 08/28/2023 4:59 PM

## 2023-08-28 NOTE — Plan of Care (Signed)
 No chest pain cardiac cath today without intervention.

## 2023-08-29 ENCOUNTER — Other Ambulatory Visit (HOSPITAL_COMMUNITY): Payer: Self-pay

## 2023-08-29 DIAGNOSIS — I214 Non-ST elevation (NSTEMI) myocardial infarction: Secondary | ICD-10-CM | POA: Diagnosis not present

## 2023-08-29 LAB — GLUCOSE, CAPILLARY
Glucose-Capillary: 244 mg/dL — ABNORMAL HIGH (ref 70–99)
Glucose-Capillary: 212 mg/dL — ABNORMAL HIGH (ref 70–99)

## 2023-08-29 LAB — C-PEPTIDE: C-Peptide: 6.6 ng/mL — ABNORMAL HIGH (ref 1.1–4.4)

## 2023-08-29 MED ORDER — MUPIROCIN 2 % EX OINT
1.0000 | TOPICAL_OINTMENT | Freq: Two times a day (BID) | CUTANEOUS | 0 refills | Status: AC
  Filled 2023-08-29: qty 22, 11d supply, fill #0

## 2023-08-29 MED ORDER — INSULIN ASPART 100 UNIT/ML FLEXPEN
0.0000 [IU] | PEN_INJECTOR | Freq: Three times a day (TID) | SUBCUTANEOUS | 11 refills | Status: AC
  Filled 2023-08-29: qty 9, 33d supply, fill #0
  Filled 2023-08-29: qty 10, 37d supply, fill #0

## 2023-08-29 MED ORDER — INSULIN ASPART 100 UNIT/ML IJ SOLN
0.0000 [IU] | Freq: Every day | INTRAMUSCULAR | 11 refills | Status: AC
  Filled 2023-08-29: qty 10, 200d supply, fill #0

## 2023-08-29 MED ORDER — ASPIRIN 81 MG PO TBEC
81.0000 mg | DELAYED_RELEASE_TABLET | Freq: Every day | ORAL | 12 refills | Status: AC
  Filled 2023-08-29: qty 30, 30d supply, fill #0

## 2023-08-29 MED ORDER — INSULIN PEN NEEDLE 32G X 4 MM MISC
0 refills | Status: AC
  Filled 2023-08-29: qty 100, 25d supply, fill #0

## 2023-08-29 MED ORDER — INSULIN GLARGINE 100 UNIT/ML SOLOSTAR PEN
10.0000 [IU] | PEN_INJECTOR | Freq: Every day | SUBCUTANEOUS | 11 refills | Status: AC
  Filled 2023-08-29: qty 3, 30d supply, fill #0
  Filled 2023-08-29: qty 10, 100d supply, fill #0

## 2023-08-29 NOTE — Discharge Summary (Addendum)
 Physician Discharge Summary  Patient ID: Anthony Forbes MRN: [id number] DOB/AGE: 1950/05/25 73 y.o.  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  Patient presented with 2-week history of chest pain and fatigue.  Chest x-ray done on outpatient basis revealed pleural effusion.  On presentation to the hospital, WBC was 14.7, sodium of 131, glucose of 312, proBNP of 1460, troponin of 497/504.  Patient was admitted for further assessment and management of likely NSTEMI.  Patient was initially initiated on IV Lasix , aspirin  and heparin .  Cardiology team was consulted to assist with patient's management.  Patient underwent cardiac catheterization (see details below).  After extensive discussion with the Cardiology team, patient has decided to be discharged only on insulin  and aspirin  (see details below).  Patient will follow up with his PCP and Cardiology team on discharge.  NSTEMI -Patient presented with 2-week history of exertional chest heaviness.    -Troponin 497> 504. EKG showing sinus rhythm with inferior Q waves and about 1 mm ST elevation as well as T wave inversions in leads V4-V6 suggesting recent inferior infarct.  Cardiologist reviewed the EKG and felt that it did not meet STEMI criteria. -Patient underwent cardiac catheterization (see below)     Acute combined and diastolic CHF -proBNP 1460 and chest x-ray showing small bilateral pleural effusions.   -Patient was initially managed with diuretics.    -Echocardiogram revealed akinesis of the inferior and inferior  lateral wall with overall mild to moderate LV dysfunction (estimated left ventricle ejection fraction of 40 to 40%), and grade 1 diastolic dysfunction.   Poorly controlled type 2 diabetes -Diabetes mellitus remains uncontrolled. - A1c of 10.6%.   - Continue to optimize     Mild leukocytosis -Patient recently treated with outpatient antibiotic for pneumonia.   -No infectious signs or symptoms at this time.   - WBC has improved from 14.7 to 11.8.      PLEASE NOTE: As per Dr. Arnoldo Lapping: "Went to bedside to speak with patient, wife, and daughter about post-MI medical therapy recommendations. I reviewed his cardiac cath and echo findings, as well as indications for medical therapy in setting of diabetes, moderate LV dysfunction, and medically treated MI. I explained the rationale for ASA, clopidogrel, high-intensity statin drug, and beta-blocker/ACE-I or ARB. I specifically emphasized benefit in reduced risk of recurrent MI and heart failure. Despite that, the patient states that he will not take anything except aspirin  and insulin . He is not interested in any other prescription medications and lists off many side effects that he will not tolerate. He plans to follow-up with his personal physician and does not wish for any cardiology follow-up in our practice. I recommended that he have a follow-up CXR in a few weeks to make sure that his effusions have resolved. He otherwise appears stable. I will discontinue all of his medications except for aspirin  and insulin  at this time.    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  Myocardium supplied by distal RCA already has akinesis on echocardiogram, it is partly collateralized by collaterals from LAD.  I do not think there is any benefit in revascularization at this point.  Recommend medical management with DAPT for 1 year, along with GDMT for HFrEF, and most importantly, management of uncontrolled type 2 diabetes mellitus.   Discussed with Dr. Arlester Ladd, who also agrees with the above thought process.   Anticipated discharge 08/29/2023.   Cody Das, MD  Discharge Exam: Blood pressure 111/63, pulse 77, temperature 98 F (36.7 C), temperature source Oral, resp. rate 20, height 5\' 8"  (1.727 m), weight 68.5 kg, SpO2 99%.   Disposition: Discharge disposition: 01-Home or Self Care       Discharge Instructions     AMB referral to Phase II Cardiac Rehabilitation   Complete by: As directed    Diagnosis: NSTEMI   After initial evaluation and assessments completed: Virtual Based Care may be provided alone or in conjunction with Phase 2 Cardiac Rehab based on patient barriers.: Yes   Intensive Cardiac Rehabilitation (ICR) [facility] location only OR Traditional Cardiac Rehabilitation (TCR) *If criteria for ICR are not met will enroll in TCR [facility] only): Yes   Diet - low sodium heart healthy   Complete by: As directed    Increase activity slowly   Complete by: As directed       Allergies as of 08/29/2023   No Known Allergies      Medication List     STOP taking these medications    albuterol  108 (90 Base) MCG/ACT inhaler Commonly known as: VENTOLIN  HFA   doxycycline  100 MG tablet Commonly known as: VIBRA -TABS       TAKE these medications    aspirin  EC 81 MG tablet Take 1 tablet (81 mg total) by mouth daily. Swallow whole. Start taking on: Aug 30, 2023   insulin  aspart 100 UNIT/ML injection Commonly known as: novoLOG  Inject 0-5 Units into the skin at bedtime.    insulin  aspart 100 UNIT/ML injection Commonly known as: novoLOG  Inject 0-9 Units into the skin 3 (three) times daily with meals.   insulin  glargine-yfgn 100 UNIT/ML injection Commonly known as: SEMGLEE  Inject 0.1 mLs (10 Units total) into the skin at bedtime.   mupirocin  ointment 2 % Commonly known as: BACTROBAN  Place 1 Application into the nose 2 (two) times daily.        Time spent: 35 Minutes.  SignedDoroteo Gasmen 08/29/2023, 12:39 PM

## 2023-08-29 NOTE — Progress Notes (Signed)
 Patient discharged with family and patient to pick up new meds from pharmacy at [facility] cone on the way to car.

## 2023-08-29 NOTE — Progress Notes (Signed)
   08/29/23 1318  AVS Discharge Documentation  AVS Discharge Instructions Including Medications Provided to patient/caregiver  Name of Person Receiving AVS Discharge Instructions Including Medications Mcclellan, Larsh  Name of Clinician That Reviewed AVS Discharge Instructions Including Medications Synthia Ewing RN     AVS instructions were reviewed with patient and Mariah Shines, his wife, at bedside. All questions were answered all personal belongings were returned.

## 2023-09-01 ENCOUNTER — Ambulatory Visit (HOSPITAL_BASED_OUTPATIENT_CLINIC_OR_DEPARTMENT_OTHER): Payer: Self-pay | Admitting: Internal Medicine

## 2023-09-01 LAB — LIPOPROTEIN A (LPA): Lipoprotein (a): 61.9 nmol/L — ABNORMAL HIGH (ref <75.0)

## 2023-09-13 NOTE — Progress Notes (Deleted)
 Cardiology Clinic Note   Patient Name: Anthony Forbes Date of Encounter: 09/13/2023  Primary Care Provider:  Elio Hunter HERO, CRNP Primary Cardiologist:  None  Patient Profile    Anthony Forbes 73 year old male presents to the clinic today for follow-up evaluation of his NSTEMI post [facility] 08/28/2023.  Past Medical History    Past Medical History:  Diagnosis Date   Diabetes mellitus without complication (HCC)    Rib injury    Past Surgical History:  Procedure Laterality Date   COSMETIC SURGERY     LEFT HEART CATH AND CORONARY ANGIOGRAPHY N/A 08/28/2023   Procedure: LEFT HEART CATH AND CORONARY ANGIOGRAPHY;  Surgeon: Elmira Newman PARAS, MD;  Location: [facility] INVASIVE CV LAB;  Service: Cardiovascular;  Laterality: N/A;    Allergies  No Known Allergies  History of Present Illness    Anthony Forbes has a PMH of type 2 diabetes, coronary artery disease, CHF, leukocytosis.  He was admitted to the emergency department on 08/27/2023 and discharged on 08/29/2023.  He presented with a 2-week history of chest pain and fatigue.  His chest x-ray showed pleural effusion.  On presentation to the hospital his white blood cell count was 14.7.  His glucose was 312.  His sodium was 131.  His proBNP was 1460 and his troponins were 497 and 540.  He was admitted for further assessment.  He received IV Lasix  aspirin  and heparin .  Cardiology was consulted.  He underwent LHC on 08/28/2023.  He was noted to have normal left main.  Minimal luminal irregularities in his LAD.  Circumflex had no significant disease.  His distal RCA was occluded after RV marginal which gave off some septal branches RPDA and R PL were partially collateralized from LAD septal collaterals.  It was felt that patient had acute MI few weeks ago from distal RCA occlusion.  He was noted to have distal RCA akinesis on echocardiogram.  Medical management was recommended.  Recommendations for starting clopidogrel, statin therapy,  beta-blocker therapy/ACE ARB were made.  He did not wish to start any medications except for aspirin  and insulin  post cath.  He plan to follow-up with his PCP.  He reported that he did not wish for any cardiology follow-up in our practice.  It was also recommended that he have follow-up chest x-ray to evaluate his pleural effusions.  His echocardiogram showed LVEF of 40-45% and G1 DD.  He was noted to have mild leukocytosis and had been treated with outpatient antibiotics.  His WBCs improved from 14.7-11.8.  His A1c was noted to be 10.6.  Further optimization was recommended.  He was discharged on aspirin  and his home insulin  regimen was continued.  He presents to the clinic today for follow-up evaluation and states***.  *** denies chest pain, shortness of breath, lower extremity edema, fatigue, palpitations, melena, hematuria, hemoptysis, diaphoresis, weakness, presyncope, syncope, orthopnea, and PND.  NSTEMI-no chest pain today.  Underwent cardiac catheterization on 08/28/2023 and was noted to have normal left main, minimal luminal irregularities in his LAD, no significant disease in the circumflex, and occluded distal RCA with collaterals from LAD.  Echocardiogram showed RCA region akinesis.  Medical management was recommended.  Patient did not wish to start further medical therapy and agreed to continue aspirin  and insulin . Heart healthy low-sodium diet Increase physical activity as tolerated  Acute systolic CHF-echocardiogram showed akinesis in the inferior and inferior lateral walls with moderate LV dysfunction and G1 DD.  Patient previously did not wish to start  GDMT. Heart healthy low-sodium diet Increase physical activity as tolerated Plan for repeat echocardiogram in 3 months  Hyperlipidemia-LDL***.  Previously offered statin therapy. High-fiber diet Increase physical activity as tolerated  Type 2 diabetes-A1c 10.6 on last check. Carb modified diet Increase physical activity as  tolerated Continue insulin  Follows with PCP  Disposition: Follow-up with Dr. Wonda or me in 3-4 months.  Home Medications    Prior to Admission medications   Medication Sig Start Date End Date Taking? Authorizing Provider  aspirin  EC 81 MG tablet Take 1 tablet (81 mg total) by mouth daily. Swallow whole. 08/30/23   Anthony Leatrice FERNS, MD  insulin  aspart (NOVOLOG ) 100 UNIT/ML injection Inject 0-5 Units into the skin at bedtime. 08/29/23   Anthony Leatrice FERNS, MD  insulin  aspart (NOVOLOG ) 100 UNIT/ML FlexPen Inject 0-9 Units into the skin 3 (three) times daily with meals. 08/29/23   Anthony Leatrice FERNS, MD  insulin  glargine (LANTUS ) 100 UNIT/ML Solostar Pen Inject 10 Units into the skin at bedtime. 08/29/23   Anthony Leatrice FERNS, MD  Insulin  Pen Needle 32G X 4 MM MISC Use as directed to inject insulin  four times daily 08/29/23   Anthony Leatrice I, MD  mupirocin  ointment (BACTROBAN ) 2 % Place 1 Application into the nose 2 (two) times daily. 08/29/23   Anthony Leatrice FERNS, MD    Family History    No family history on file. He indicated that his mother is alive. He indicated that his brother is alive.  Social History    Social History   Socioeconomic History   Marital status: Married    Spouse name: Not on file   Number of children: Not on file   Years of education: Not on file   Highest education level: Not on file  Occupational History   Not on file  Tobacco Use   Smoking status: Never   Smokeless tobacco: Never  Vaping Use   Vaping status: Never Used  Substance and Sexual Activity   Alcohol use: Yes    Alcohol/week: 2.0 - 3.0 standard drinks of alcohol    Types: 2 - 3 Standard drinks or equivalent per week   Drug use: No   Sexual activity: Not on file  Other Topics Concern   Not on file  Social History Narrative   Not on file   Social Drivers of Health   Financial Resource Strain: Not on file  Food Insecurity: No Food Insecurity (08/27/2023)   Hunger Vital Sign     Worried About Running Out of Food in the Last Year: Never true    Ran Out of Food in the Last Year: Never true  Transportation Needs: No Transportation Needs (08/27/2023)   PRAPARE - Administrator, Civil Service (Medical): No    Lack of Transportation (Non-Medical): No  Physical Activity: Not on file  Stress: Not on file  Social Connections: Socially Integrated (08/27/2023)   Social Connection and Isolation Panel [NHANES]    Frequency of Communication with Friends and Family: More than three times a week    Frequency of Social Gatherings with Friends and Family: More than three times a week    Attends Religious Services: More than 4 times per year    Active Member of [employer] or Organizations: Yes    Attends Banker Meetings: More than 4 times per year    Marital Status: Married  Catering manager Violence: Not At Risk (08/27/2023)   Humiliation, Afraid, Rape, and Kick questionnaire  Fear of Current or Ex-Partner: No    Emotionally Abused: No    Physically Abused: No    Sexually Abused: No     Review of Systems    General:  No chills, fever, night sweats or weight changes.  Cardiovascular:  No chest pain, dyspnea on exertion, edema, orthopnea, palpitations, paroxysmal nocturnal dyspnea. Dermatological: No rash, lesions/masses Respiratory: No cough, dyspnea Urologic: No hematuria, dysuria Abdominal:   No nausea, vomiting, diarrhea, bright red blood per rectum, melena, or hematemesis Neurologic:  No visual changes, wkns, changes in mental status. All other systems reviewed and are otherwise negative except as noted above.  Physical Exam    VS:  There were no vitals taken for this visit. , BMI There is no height or weight on file to calculate BMI. GEN: Well nourished, well developed, in no acute distress. HEENT: normal. Neck: Supple, no JVD, carotid bruits, or masses. Cardiac: RRR, no murmurs, rubs, or gallops. No clubbing, cyanosis, edema.  Radials/DP/PT 2+  and equal bilaterally.  Respiratory:  Respirations regular and unlabored, clear to auscultation bilaterally. GI: Soft, nontender, nondistended, BS + x 4. MS: no deformity or atrophy. Skin: warm and dry, no rash. Neuro:  Strength and sensation are intact. Psych: Normal affect.  Accessory Clinical Findings    Recent Labs: 08/27/2023: Pro Brain Natriuretic Peptide 1,460.0 08/28/2023: ALT 36; BUN 13; Creatinine, Ser 0.85; Hemoglobin 13.7; Platelets 354; Potassium 4.4; Sodium 136   Recent Lipid Panel    Component Value Date/Time   CHOL 115 08/28/2023 0959   TRIG 67 08/28/2023 0959   HDL 37 (L) 08/28/2023 0959   CHOLHDL 3.1 08/28/2023 0959   VLDL 13 08/28/2023 0959   LDLCALC 65 08/28/2023 0959    No BP recorded.  {Refresh Note OR Click here to enter BP  :1}***    ECG personally reviewed by me today- ***     Echocardiogram 08/28/2023  IMPRESSIONS     1. Akinesis of the inferior and inferolateral wall with overall mild to  moderate LV dysfunction.   2. Left ventricular ejection fraction, by estimation, is 40 to 45%. The  left ventricle has mild to moderately decreased function. The left  ventricle demonstrates regional wall motion abnormalities (see scoring  diagram/findings for description). There is   mild left ventricular hypertrophy. Left ventricular diastolic parameters  are consistent with Grade I diastolic dysfunction (impaired relaxation).   3. Right ventricular systolic function is normal. The right ventricular  size is normal.   4. A small pericardial effusion is present.   5. The mitral valve is normal in structure. Trivial mitral valve  regurgitation. No evidence of mitral stenosis.   6. The aortic valve is tricuspid. Aortic valve regurgitation is not  visualized. No aortic stenosis is present.   7. The inferior vena cava is dilated in size with >50% respiratory  variability, suggesting right atrial pressure of 8 mmHg.   Comparison(s): No prior Echocardiogram.    FINDINGS   Left Ventricle: Left ventricular ejection fraction, by estimation, is 40  to 45%. The left ventricle has mild to moderately decreased function. The  left ventricle demonstrates regional wall motion abnormalities. Definity   contrast agent was given IV to  delineate the left ventricular endocardial borders. The left ventricular  internal cavity size was normal in size. There is mild left ventricular  hypertrophy. Left ventricular diastolic parameters are consistent with  Grade I diastolic dysfunction  (impaired relaxation).   Right Ventricle: The right ventricular size is normal. Right ventricular  systolic function is normal.   Left Atrium: Left atrial size was normal in size.   Right Atrium: Right atrial size was normal in size.   Pericardium: A small pericardial effusion is present.   Mitral Valve: The mitral valve is normal in structure. Trivial mitral  valve regurgitation. No evidence of mitral valve stenosis.   Tricuspid Valve: The tricuspid valve is normal in structure. Tricuspid  valve regurgitation is trivial. No evidence of tricuspid stenosis.   Aortic Valve: The aortic valve is tricuspid. Aortic valve regurgitation is  not visualized. No aortic stenosis is present. Aortic valve peak gradient  measures 4.8 mmHg.   Pulmonic Valve: The pulmonic valve was normal in structure. Pulmonic valve  regurgitation is not visualized. No evidence of pulmonic stenosis.   Aorta: The aortic root is normal in size and structure.   Venous: The inferior vena cava is dilated in size with greater than 50%  respiratory variability, suggesting right atrial pressure of 8 mmHg.   IAS/Shunts: No atrial level shunt detected by color flow Doppler.   LHC 08/28/2023   Coronary angiography 08/28/2023: LM: Normal LAD: Minimal luminal irregularities Lcx: No significant disease RCA: Distal RCA occlusion after an RV marginal that gives off some septal branches           RPDA and RPL  they are partly collateralized from LAD septal collaterals   LVEDP 13 mmHg      Going by history, patient likely had acute myocardial infarction few weeks ago from distal RCA occlusion.  Myocardium supplied by distal RCA already has akinesis on echocardiogram, it is partly collateralized by collaterals from LAD.  I do not think there is any benefit in revascularization at this point.  Recommend medical management with DAPT for 1 year, along with GDMT for HFrEF, and most importantly, management of uncontrolled type 2 diabetes mellitus.   Discussed with Dr. Wonda, who also agrees with the above thought process.   Anticipated discharge 08/29/2023.   Newman JINNY Lawrence, MD    Assessment & Plan   1.  ***   Josefa HERO. Colie Fugitt NP-C     09/13/2023, 7:07 PM [facility] [address] Office [phone] Fax [phone]    I spent***minutes examining this patient, reviewing medications, and using patient centered shared decision making involving their cardiac care.   I spent  20 minutes reviewing past medical history,  medications, and prior cardiac tests.

## 2023-09-16 ENCOUNTER — Ambulatory Visit: Attending: General Practice | Admitting: General Practice

## 2023-09-22 ENCOUNTER — Other Ambulatory Visit (HOSPITAL_COMMUNITY): Payer: Self-pay

## 2023-09-23 ENCOUNTER — Other Ambulatory Visit (HOSPITAL_COMMUNITY): Payer: Self-pay

## 2023-12-30 ENCOUNTER — Other Ambulatory Visit (HOSPITAL_COMMUNITY): Payer: Self-pay

## 2024-01-20 ENCOUNTER — Other Ambulatory Visit: Payer: Self-pay | Admitting: Internal Medicine
